# Patient Record
Sex: Female | Born: 1960
Health system: Southern US, Community
[De-identification: ages and names within clinical notes are randomized; demographics above are authoritative.]

## PROBLEM LIST (undated history)

## (undated) DIAGNOSIS — D249 Benign neoplasm of unspecified breast: Secondary | ICD-10-CM

## (undated) DIAGNOSIS — Z8669 Personal history of other diseases of the nervous system and sense organs: Secondary | ICD-10-CM

## (undated) DIAGNOSIS — M722 Plantar fascial fibromatosis: Secondary | ICD-10-CM

## (undated) DIAGNOSIS — M719 Bursopathy, unspecified: Secondary | ICD-10-CM

## (undated) DIAGNOSIS — T7840XA Allergy, unspecified, initial encounter: Secondary | ICD-10-CM

## (undated) HISTORY — DX: Plantar fascial fibromatosis: M72.2

## (undated) HISTORY — PX: BREAST BIOPSY: SHX20

## (undated) HISTORY — DX: Personal history of other diseases of the nervous system and sense organs: Z86.69

## (undated) HISTORY — DX: Allergy, unspecified, initial encounter: T78.40XA

## (undated) HISTORY — DX: Bursopathy, unspecified: M71.9

## (undated) HISTORY — DX: Benign neoplasm of unspecified breast: D24.9

---

## 2003-11-27 HISTORY — PX: ELBOW SURGERY: SHX618

## 2004-11-26 HISTORY — PX: ABDOMINAL HYSTERECTOMY: SHX81

## 2004-12-07 ENCOUNTER — Ambulatory Visit: Payer: Self-pay | Admitting: Unknown Physician Specialty

## 2005-07-12 ENCOUNTER — Ambulatory Visit: Payer: Self-pay | Admitting: Unknown Physician Specialty

## 2009-01-25 ENCOUNTER — Ambulatory Visit: Payer: Self-pay | Admitting: General Surgery

## 2009-02-14 ENCOUNTER — Ambulatory Visit: Payer: Self-pay | Admitting: General Surgery

## 2009-07-28 ENCOUNTER — Ambulatory Visit: Payer: Self-pay | Admitting: General Surgery

## 2009-08-04 ENCOUNTER — Ambulatory Visit: Payer: Self-pay | Admitting: General Surgery

## 2009-11-26 DIAGNOSIS — D249 Benign neoplasm of unspecified breast: Secondary | ICD-10-CM

## 2009-11-26 DIAGNOSIS — M719 Bursopathy, unspecified: Secondary | ICD-10-CM

## 2009-11-26 HISTORY — DX: Bursopathy, unspecified: M71.9

## 2009-11-26 HISTORY — DX: Benign neoplasm of unspecified breast: D24.9

## 2010-01-04 ENCOUNTER — Ambulatory Visit: Payer: Self-pay | Admitting: General Surgery

## 2010-08-08 ENCOUNTER — Ambulatory Visit: Payer: Self-pay | Admitting: Family Medicine

## 2011-01-25 ENCOUNTER — Ambulatory Visit: Payer: Self-pay | Admitting: General Surgery

## 2012-01-28 ENCOUNTER — Ambulatory Visit: Payer: Self-pay | Admitting: General Surgery

## 2012-03-24 ENCOUNTER — Encounter: Payer: Self-pay | Admitting: Internal Medicine

## 2012-03-24 ENCOUNTER — Ambulatory Visit (INDEPENDENT_AMBULATORY_CARE_PROVIDER_SITE_OTHER): Payer: PRIVATE HEALTH INSURANCE | Admitting: Internal Medicine

## 2012-03-24 DIAGNOSIS — M722 Plantar fascial fibromatosis: Secondary | ICD-10-CM | POA: Insufficient documentation

## 2012-03-24 DIAGNOSIS — J309 Allergic rhinitis, unspecified: Secondary | ICD-10-CM | POA: Insufficient documentation

## 2012-03-24 DIAGNOSIS — Z8669 Personal history of other diseases of the nervous system and sense organs: Secondary | ICD-10-CM | POA: Insufficient documentation

## 2012-03-24 DIAGNOSIS — Z20828 Contact with and (suspected) exposure to other viral communicable diseases: Secondary | ICD-10-CM

## 2012-03-24 DIAGNOSIS — G43909 Migraine, unspecified, not intractable, without status migrainosus: Secondary | ICD-10-CM | POA: Insufficient documentation

## 2012-03-24 DIAGNOSIS — Z13220 Encounter for screening for lipoid disorders: Secondary | ICD-10-CM

## 2012-03-24 DIAGNOSIS — R091 Pleurisy: Secondary | ICD-10-CM | POA: Insufficient documentation

## 2012-03-24 DIAGNOSIS — Z205 Contact with and (suspected) exposure to viral hepatitis: Secondary | ICD-10-CM

## 2012-03-24 MED ORDER — FLUCONAZOLE 150 MG PO TABS: 150.0000 mg | ORAL_TABLET | Freq: Every day | ORAL | Status: AC

## 2012-03-24 MED ORDER — AZITHROMYCIN 500 MG PO TABS: 500.0000 mg | ORAL_TABLET | Freq: Every day | ORAL | Status: AC

## 2012-03-24 MED ORDER — ACYCLOVIR 400 MG PO TABS: 400.0000 mg | ORAL_TABLET | Freq: Two times a day (BID) | ORAL | Status: DC

## 2012-03-24 NOTE — Assessment & Plan Note (Signed)
Managed with daily saline lavage.

## 2012-03-24 NOTE — Assessment & Plan Note (Signed)
Fasting lipids prior to next visit, her annual PE

## 2012-03-24 NOTE — Assessment & Plan Note (Signed)
Resolved with NSAIDs and MR

## 2012-03-24 NOTE — Assessment & Plan Note (Signed)
Her long time boyfriend has Hep C.  She is requesting testing.

## 2012-03-24 NOTE — Patient Instructions (Addendum)
Consider the Low Glycemic Index Diet and 6 smaller meals daily .  This boosts your metabolism and regulates your sugars:   7 AM Low carbohydrate Protein  Shakes (EAS Carb Control  Or Atkins ,  Available everywhere,   In  cases at [employer] )  2.5 carbs  (Add or substitute a toasted sandwhich thin w/ peanut butter)  10 AM: Protein bar by Atkins (snack size,  Chocolate lover's variety at  [employer])  These are very high in fiber also  Lunch: sandwich on pita bread or flatbread (Joseph's makes a pita bread and a flat bread , available at [employer] and [employer]; Toufayah makes a low carb flatbread available at [employer] and [employer]) Mission makes a low carb whole wheat tortilla available at [employer] most grocery stores   3 PM:  Mid day :  Another protein bar,  Or a  cheese stick, 1/4 cup of almonds, walnuts, pistachios, pecans, peanuts,  Macadamia nuts  6 PM  Dinner:  "mean and green:"  Meat/chicken/fish, salad, and green veggie : use ranch, vinagrette,  Blue cheese, etc  9 PM snack : Breyer's low carb fudgsicle or  ice cream bar (Carb Smart), or  Weight Watcher's ice cream bar , or another protein shake  The highlighted names are low carb high fiber bread alternatives   Return in May for fasting labs and physical

## 2012-03-24 NOTE — Progress Notes (Signed)
Patient ID: Ashley [name], female   DOB: 10/29/1961, 51 y.o.   MRN: [id number]  Patient Active Problem List  Diagnoses  . Pleurisy  . Allergy  . Plantar fasciitis of left foot  . History of migraine headaches  . Contact with and (suspected) exposure to viral hepatitis  . Screening for lipoid disorders    Subjective:  CC:   Chief Complaint  Patient presents with  . New Patient    HPI:   Ashley [name] is a 51 y.o. female who presents to establish primary care.  She is a single female., works for [employer],  And is dating an older man who has Hepatitis C with cirrhosis. No prior testing herself. Does not share razors.  Exercises infrequently due to plantar fasciitis and work schedule.  No history of exertional dyspnea or chest pain.  Occasional insomnia and migraines, has stopped using imitrex bc it caused chest and right  Scapular tightness last trial.  No prior cardiology evaluation.  Past Medical History  Diagnosis Date  . Pleurisy Jan 2013    treated with lorazepam and ibuprofen  . Allergy     managed with saline lavage  . Plantar fasciitis of left foot   . History of migraine headaches     Past Surgical History  Procedure Date  . Abdominal hysterectomy     for precancerous cervix         The following portions of the patient's history were reviewed and updated as appropriate: Allergies, current medications, and problem list.    Review of Systems:   12 Pt  review of systems was negative except those addressed in the HPI,     History   Social History  . Marital Status: Divorced    Spouse Name: N/A    Number of Children: N/A  . Years of Education: N/A   Occupational History  . Not on file.   Social History Main Topics  . Smoking status: Former Smoker    Quit date: 09/24/2011  . Smokeless tobacco: Never Used  . Alcohol Use: Yes  . Drug Use: No  . Sexually Active: Not on file   Other Topics Concern  . Not on file   Social History  Narrative  . No narrative on file    Objective:  BP 124/62  Pulse 76  Temp(Src) 98 F (36.7 C) (Oral)  Resp 16  Ht 5\' 7"  (1.702 m)  Wt 161 lb 8 oz (73.256 kg)  BMI 25.29 kg/m2  SpO2 98%  General appearance: alert, cooperative and appears stated age Ears: normal TM's and external ear canals both ears Throat: lips, mucosa, and tongue normal; teeth and gums normal Neck: no adenopathy, no carotid bruit, supple, symmetrical, trachea midline and thyroid not enlarged, symmetric, no tenderness/mass/nodules Back: symmetric, no curvature. ROM normal. No CVA tenderness. Lungs: clear to auscultation bilaterally Heart: regular rate and rhythm, S1, S2 normal, no murmur, click, rub or gallop Abdomen: soft, non-tender; bowel sounds normal; no masses,  no organomegaly Pulses: 2+ and symmetric Skin: Skin color, texture, turgor normal. No rashes or lesions Lymph nodes: Cervical, supraclavicular, and axillary nodes normal.  Assessment and Plan:  Pleurisy Resolved with NSAIDs and MR  Allergy Managed with daily saline lavage.   Contact with and (suspected) exposure to viral hepatitis Her long time boyfriend has Hep C.  She is requesting testing.   Screening for lipoid disorders Fasting lipids prior to next visit, her annual PE    Updated Medication List  Outpatient Encounter Prescriptions as of 03/24/2012  Medication Sig Dispense Refill  . acyclovir (ZOVIRAX) 400 MG tablet Take 1 tablet (400 mg total) by mouth 2 (two) times daily.  60 tablet  11  . azithromycin (ZITHROMAX) 500 MG tablet Take 1 tablet (500 mg total) by mouth daily.  7 tablet  0  . fluconazole (DIFLUCAN) 150 MG tablet Take 1 tablet (150 mg total) by mouth daily.  2 tablet  0  . ibuprofen (ADVIL,MOTRIN) 800 MG tablet Take 800 mg by mouth every 8 (eight) hours as needed.      . loratadine (CLARITIN) 10 MG tablet Take 10 mg by mouth daily.      . Naproxen Sodium (ALEVE PO) Take by mouth.      . Probiotic Product (PROBIOTIC  FORMULA PO) Take by mouth.      . DISCONTD: acyclovir (ZOVIRAX) 400 MG tablet Take 400 mg by mouth 2 (two) times daily.         Orders Placed This Encounter  Procedures  . HM MAMMOGRAPHY  . Hepatitis C RNA quantitative  . Hepatitis C Antibody  . Lipid panel  . COMPLETE METABOLIC PANEL WITH GFR  . TSH    No Follow-up on file.

## 2012-05-15 ENCOUNTER — Other Ambulatory Visit (INDEPENDENT_AMBULATORY_CARE_PROVIDER_SITE_OTHER): Payer: PRIVATE HEALTH INSURANCE | Admitting: *Deleted

## 2012-05-15 DIAGNOSIS — Z13220 Encounter for screening for lipoid disorders: Secondary | ICD-10-CM

## 2012-05-15 DIAGNOSIS — Z20828 Contact with and (suspected) exposure to other viral communicable diseases: Secondary | ICD-10-CM

## 2012-05-15 DIAGNOSIS — Z205 Contact with and (suspected) exposure to viral hepatitis: Secondary | ICD-10-CM

## 2012-05-15 LAB — LIPID PANEL
HDL: 53.7 mg/dL (ref >39.00)
Triglycerides: 111 mg/dL (ref 0.0–149.0)
VLDL: 22.2 mg/dL (ref 0.0–40.0)

## 2012-05-15 LAB — LDL CHOLESTEROL, DIRECT: Direct LDL: 165.7 mg/dL

## 2012-05-15 LAB — TSH: TSH: 1.33 u[IU]/mL (ref 0.35–5.50)

## 2012-05-16 LAB — COMPLETE METABOLIC PANEL WITH GFR
AST: 23 U/L (ref 0–37)
Albumin: 4.1 g/dL (ref 3.5–5.2)
Alkaline Phosphatase: 72 U/L (ref 39–117)
BUN: 13 mg/dL (ref 6–23)
Creat: 1.02 mg/dL (ref 0.50–1.10)
GFR, Est Non African American: 64 mL/min
Glucose, Bld: 92 mg/dL (ref 70–99)
Potassium: 5 mEq/L (ref 3.5–5.3)
Total Bilirubin: 0.6 mg/dL (ref 0.3–1.2)

## 2012-05-16 LAB — HEPATITIS C RNA QUANTITATIVE

## 2012-05-16 LAB — HEPATITIS C ANTIBODY: HCV Ab: NEGATIVE

## 2012-05-21 ENCOUNTER — Encounter: Payer: Self-pay | Admitting: Internal Medicine

## 2012-05-21 ENCOUNTER — Ambulatory Visit (INDEPENDENT_AMBULATORY_CARE_PROVIDER_SITE_OTHER): Payer: PRIVATE HEALTH INSURANCE | Admitting: Internal Medicine

## 2012-05-21 ENCOUNTER — Other Ambulatory Visit: Payer: Self-pay | Admitting: Internal Medicine

## 2012-05-21 VITALS — BP 114/68 | HR 76 | Temp 98.1°F | Resp 16 | Ht 67.0 in | Wt 167.5 lb

## 2012-05-21 DIAGNOSIS — B373 Candidiasis of vulva and vagina: Secondary | ICD-10-CM

## 2012-05-21 DIAGNOSIS — B3731 Acute candidiasis of vulva and vagina: Secondary | ICD-10-CM

## 2012-05-21 NOTE — Patient Instructions (Addendum)
I will prescribe a steroid for yur itching once I have ruled out vaginal infection.  If the steroid dose not resolve the itching, we can try hormonal creams.  I recommend you see your chiropractor about your back and hip pain .  We may need to get an MRI of your lumbar spine.  We may get a second opinion on your plantar fasciitis from a another podiatist at an academic center.

## 2012-05-22 LAB — FECAL OCCULT BLOOD, GUAIAC: Fecal Occult Blood: NEGATIVE

## 2012-05-24 LAB — CULTURE, ROUTINE-GENITAL: Organism ID, Bacteria: NORMAL

## 2012-05-25 ENCOUNTER — Encounter: Payer: Self-pay | Admitting: Internal Medicine

## 2012-05-25 NOTE — Progress Notes (Addendum)
  Subjective:     Ashley [name] is a 51 y.o. female and is here for a comprehensive physical exam. The patient reports problems - several, all of which are chronic.  History   Social History  . Marital Status: Divorced    Spouse Name: N/A    Number of Children: N/A  . Years of Education: N/A   Occupational History  . Not on file.   Social History Main Topics  . Smoking status: Former Smoker    Quit date: 09/24/2011  . Smokeless tobacco: Never Used  . Alcohol Use: Yes  . Drug Use: No  . Sexually Active: Not on file   Other Topics Concern  . Not on file   Social History Narrative  . No narrative on file   Health Maintenance  Topic Date Due  . Pap Smear  02/28/1980  . Tetanus/tdap  02/27/1981  . Mammogram  02/28/2012  . Colonoscopy  02/28/2012  . Influenza Vaccine  08/26/2012    The following portions of the patient's history were reviewed and updated as appropriate: allergies, current medications, past family history, past medical history, past social history, past surgical history and problem list.  Review of Systems Pertinent items are noted in HPI.   Objective:   BP 114/68  Pulse 76  Temp 98.1 F (36.7 C) (Oral)  Resp 16  Ht 5\' 7"  (1.702 m)  Wt 167 lb 8 oz (75.978 kg)  BMI 26.23 kg/m2  SpO2 96%  General Appearance:    Alert, cooperative, no distress, appears stated age  Head:    Normocephalic, without obvious abnormality, atraumatic  Eyes:    PERRL, conjunctiva/corneas clear, EOM's intact, fundi    benign, both eyes  Ears:    Normal TM's and external ear canals, both ears  Nose:   Nares normal, septum midline, mucosa normal, no drainage    or sinus tenderness  Throat:   Lips, mucosa, and tongue normal; teeth and gums normal  Neck:   Supple, symmetrical, trachea midline, no adenopathy;    thyroid:  no enlargement/tenderness/nodules; no carotid   bruit or JVD  Back:     Symmetric, no curvature, ROM normal, no CVA tenderness  Lungs:     Clear to auscultation  bilaterally, respirations unlabored  Chest Wall:    No tenderness or deformity   Heart:    Regular rate and rhythm, S1 and S2 normal, no murmur, rub   or gallop  Breast Exam:    No tenderness, masses, or nipple abnormality  Abdomen:     Soft, non-tender, bowel sounds active all four quadrants,    no masses, no organomegaly  Genitalia:    Normal female without lesion, discharge or tenderness  Rectal:    Normal tone, normal prostate, no masses or tenderness;   guaiac negative stool  Extremities:   Extremities normal, atraumatic, no cyanosis or edema  Pulses:   2+ and symmetric all extremities  Skin:   Skin color, texture, turgor normal, no rashes or lesions  Lymph nodes:   Cervical, supraclavicular, and axillary nodes normal  Neurologic:   CNII-XII intact, normal strength, sensation and reflexes    throughout    Assessment:    Healthy female exam.  Plan:     See After Visit Summary for Counseling Recommendations

## 2012-05-30 ENCOUNTER — Encounter: Payer: Self-pay | Admitting: Internal Medicine

## 2012-05-30 ENCOUNTER — Ambulatory Visit (INDEPENDENT_AMBULATORY_CARE_PROVIDER_SITE_OTHER): Payer: PRIVATE HEALTH INSURANCE | Admitting: Internal Medicine

## 2012-05-30 VITALS — BP 100/58 | HR 83 | Temp 98.4°F | Resp 16 | Wt 169.0 lb

## 2012-05-30 DIAGNOSIS — H6980 Other specified disorders of Eustachian tube, unspecified ear: Secondary | ICD-10-CM

## 2012-05-30 MED ORDER — PREDNISONE (PAK) 10 MG PO TABS: ORAL_TABLET | ORAL | Status: AC

## 2012-05-30 MED ORDER — MOMETASONE FUROATE 50 MCG/ACT NA SUSP: NASAL | Status: DC

## 2012-05-30 NOTE — Progress Notes (Signed)
Patient ID: Ashley [name], female   DOB: November 03, 1961, 51 y.o.   MRN: [id number] Patient Active Problem List  Diagnosis  . Pleurisy  . Allergy  . Plantar fasciitis of left foot  . History of migraine headaches  . Contact with and (suspected) exposure to viral hepatitis  . Screening for lipoid disorders    Subjective:  CC:   Chief Complaint  Patient presents with  . Otalgia    HPI:   Ashley [name] a 51 y.o. female who presents with recurrent right ear pain .  She was treated in April with azithromycin, and decongestants but the feeling in right ear never completely normalized.  She denies headaches, vertigo and ear drainage..   Ear does not pop of autoinflation.  She has had a prior ENT eval by [facility] Dr. Candida Peeling who rxd allegra d. Which  She stopped taking when it became OTC medication . No recent air flights, swimming or sick contacts.   Past Medical History  Diagnosis Date  . Pleurisy Jan 2013    treated with lorazepam and ibuprofen  . Allergy     managed with saline lavage  . Plantar fasciitis of left foot   . History of migraine headaches     Past Surgical History  Procedure Date  . Abdominal hysterectomy     for precancerous cervix     The following portions of the patient's history were reviewed and updated as appropriate: Allergies, current medications, and problem list.    Review of Systems:  The remainder of a comprejensive  review of systems was negative except those addressed in the HPI,   History   Social History  . Marital Status: Divorced    Spouse Name: N/A    Number of Children: N/A  . Years of Education: N/A   Occupational History  . Not on file.   Social History Main Topics  . Smoking status: Former Smoker    Quit date: 09/24/2011  . Smokeless tobacco: Never Used  . Alcohol Use: Yes  . Drug Use: No  . Sexually Active: Not on file   Other Topics Concern  . Not on file   Social History Narrative  . No narrative on file     Objective:  BP 100/58  Pulse 83  Temp 98.4 F (36.9 C) (Oral)  Resp 16  Wt 169 lb (76.658 kg)  SpO2 98%  General appearance: alert, cooperative and appears stated age Ears: both  TM's are opaque with serous effusions,  No erythema or bulging noted Throat: lips, mucosa, and tongue normal; teeth and gums normal Neck: no adenopathy, no carotid bruit, supple, symmetrical, trachea midline and thyroid not enlarged, symmetric, no tenderness/mass/nodules Lungs: clear to auscultation bilaterally Heart: regular rate and rhythm, S1, S2 normal, no murmur, click, rub or gallop Lymph nodes: Cervical, supraclavicular, and axillary nodes normal.  Assessment and Plan:  Eustachian tube dysfunction Secondary to uncontrolled congestion of presumed reversible/allergic etiology.  Prednisone, nasonex and sudafed PE short term.  Resume daily allegra.    Updated Medication List Outpatient Encounter Prescriptions as of 05/30/2012  Medication Sig Dispense Refill  . acyclovir (ZOVIRAX) 400 MG tablet Take 1 tablet (400 mg total) by mouth 2 (two) times daily.  60 tablet  11  . ibuprofen (ADVIL,MOTRIN) 800 MG tablet Take 800 mg by mouth every 8 (eight) hours as needed.      . loratadine (CLARITIN) 10 MG tablet Take 10 mg by mouth daily.      . Naproxen  Sodium (ALEVE PO) Take by mouth.      . Probiotic Product (PROBIOTIC FORMULA PO) Take by mouth.      . mometasone (NASONEX) 50 MCG/ACT nasal spray 2 sprays in each nostril daily  17 g  12  . predniSONE (STERAPRED UNI-PAK) 10 MG tablet 6 tablets on Day 1 , then reduce by 1 tablet daily until gone  21 tablet  0

## 2012-05-30 NOTE — Patient Instructions (Addendum)
I am going to treat you with a prednisone  Taper for 6 days,  While we can get nasonex on board.  Use sudafed PE 10 to 30 mg every 6 hours for decongestant,  And benadryl (diphenhydramine) 25 mg at bedtime  Generic allegra is fexofenadine 180 mg daily    Continue once or twice daily saline flushes (before your nasonex)

## 2012-06-01 DIAGNOSIS — H6990 Unspecified Eustachian tube disorder, unspecified ear: Secondary | ICD-10-CM | POA: Insufficient documentation

## 2012-06-01 DIAGNOSIS — H6980 Other specified disorders of Eustachian tube, unspecified ear: Secondary | ICD-10-CM | POA: Insufficient documentation

## 2012-06-01 NOTE — Assessment & Plan Note (Signed)
Secondary to uncontrolled congestion of presumed reversible/allergic etiology.  Prednisone, nasonex and sudafed PE short term.  Resume daily allegra.

## 2012-06-12 ENCOUNTER — Encounter: Payer: Self-pay | Admitting: Internal Medicine

## 2012-06-12 ENCOUNTER — Ambulatory Visit (INDEPENDENT_AMBULATORY_CARE_PROVIDER_SITE_OTHER): Payer: PRIVATE HEALTH INSURANCE | Admitting: Internal Medicine

## 2012-06-12 VITALS — BP 120/80 | HR 68 | Temp 98.7°F | Ht 67.0 in | Wt 168.0 lb

## 2012-06-12 DIAGNOSIS — N952 Postmenopausal atrophic vaginitis: Secondary | ICD-10-CM

## 2012-06-12 DIAGNOSIS — J309 Allergic rhinitis, unspecified: Secondary | ICD-10-CM

## 2012-06-12 DIAGNOSIS — H6980 Other specified disorders of Eustachian tube, unspecified ear: Secondary | ICD-10-CM

## 2012-06-12 MED ORDER — ESTRADIOL 0.1 MG/GM VA CREA: TOPICAL_CREAM | VAGINAL | Status: DC

## 2012-06-12 NOTE — Progress Notes (Signed)
Patient ID: Ashley [name], female   DOB: 10-10-1961, 51 y.o.   MRN: [id number]  Patient Active Problem List  Diagnosis  . Pleurisy  . Allergic rhinitis  . Plantar fasciitis of left foot  . History of migraine headaches  . Contact with and (suspected) exposure to viral hepatitis  . Screening for lipoid disorders  . Eustachian tube dysfunction  . Perimenopausal atrophic vaginitis    Subjective:  CC:   Chief Complaint  Patient presents with  . Vaginitis    ?possible yeast infection  . Otalgia    Right ear    HPI:   Ashley [name] a 51 y.o. female who presents Persistent vaginal burning despite recent normal pelvic exam. She denies discharge but is frustrated because the symptoms are preventing her from having intimacy.  We discussed s estrogen vaginal cream at last visit she is wanting to discuss that today.   2) Her ear feels better after resuming Allegra and completing a prednisone taper. However she feels worn out and has not been sleeping well due to to the side effects of the prednisone.   Past Medical History  Diagnosis Date  . Pleurisy Jan 2013    treated with lorazepam and ibuprofen  . Allergy     managed with saline lavage  . Plantar fasciitis of left foot   . History of migraine headaches     Past Surgical History  Procedure Date  . Abdominal hysterectomy     for precancerous cervix         The following portions of the patient's history were reviewed and updated as appropriate: Allergies, current medications, and problem list.    Review of Systems:   12 Pt  review of systems was negative except those addressed in the HPI,     History   Social History  . Marital Status: Divorced    Spouse Name: N/A    Number of Children: N/A  . Years of Education: N/A   Occupational History  . Not on file.   Social History Main Topics  . Smoking status: Former Smoker    Quit date: 09/24/2011  . Smokeless tobacco: Never Used  . Alcohol Use: Yes  . Drug  Use: No  . Sexually Active: Not on file   Other Topics Concern  . Not on file   Social History Narrative  . No narrative on file    Objective:  BP 120/80  Pulse 68  Temp 98.7 F (37.1 C) (Oral)  Ht 5\' 7"  (1.702 m)  Wt 168 lb (76.204 kg)  BMI 26.31 kg/m2  SpO2 99%  General appearance: alert, cooperative and appears stated age Ears: normal TM's and external ear canals both ears Neck: no adenopathy, no carotid bruit, supple, symmetrical, trachea midline and thyroid not enlarged, symmetric, no tenderness/mass/nodules Back: symmetric, no curvature. ROM normal. No CVA tenderness. Lungs: clear to auscultation bilaterally Heart: regular rate and rhythm, S1, S2 normal, no murmur, click, rub or gallop Abdomen: soft, non-tender; bowel sounds normal; no masses,  no organomegaly Pulses: 2+ and symmetric Skin: Skin color, texture, turgor normal. No rashes or lesions Lymph nodes: Cervical, supraclavicular, and axillary nodes normal.  Assessment and Plan:  Perimenopausal atrophic vaginitis Trial of estrogen cream 1-2 g nightly for 2 weeks followed by decreased use to twice weekly.  Allergic rhinitis Improved with resuming Allegra and Nasonex.. No changes today  Eustachian tube dysfunction Secondary to prolonged inflammation and congestion. Symptoms improved post prednisone use and initiation of Allegra and  Nasonex. She'll continue saline lavages Allegra and Nasonex on a daily basis.   Updated Medication List Outpatient Encounter Prescriptions as of 06/12/2012  Medication Sig Dispense Refill  . acyclovir (ZOVIRAX) 400 MG tablet Take 1 tablet (400 mg total) by mouth 2 (two) times daily.  60 tablet  11  . fexofenadine (ALLEGRA) 180 MG tablet Take 180 mg by mouth daily.      Marland Kitchen ibuprofen (ADVIL,MOTRIN) 800 MG tablet Take 800 mg by mouth every 8 (eight) hours as needed.      . mometasone (NASONEX) 50 MCG/ACT nasal spray 2 sprays in each nostril daily  17 g  12  . Naproxen Sodium (ALEVE  PO) Take by mouth.      . Phenylephrine HCl (NASAL DECONGESTANT PE PO) Take 1 tablet by mouth daily.      . Probiotic Product (PROBIOTIC FORMULA PO) Take by mouth.      . estradiol (ESTRACE) 0.1 MG/GM vaginal cream 1 to 2 grams per vaginal every night for 2 weeks , then twice weekly  42.5 g  12  . DISCONTD: loratadine (CLARITIN) 10 MG tablet Take 10 mg by mouth daily.         No orders of the defined types were placed in this encounter.    No Follow-up on file.

## 2012-06-12 NOTE — Patient Instructions (Addendum)
Start the vaginal cream  1 to 2 grams per night for 2 weeks,  (after sex)  , then reduce to twice weekly

## 2012-06-14 ENCOUNTER — Encounter: Payer: Self-pay | Admitting: Internal Medicine

## 2012-06-14 DIAGNOSIS — N952 Postmenopausal atrophic vaginitis: Secondary | ICD-10-CM | POA: Insufficient documentation

## 2012-06-14 NOTE — Assessment & Plan Note (Addendum)
Improved with resuming Allegra and Nasonex.. No changes today

## 2012-06-14 NOTE — Assessment & Plan Note (Signed)
Secondary to prolonged inflammation and congestion. Symptoms improved post prednisone use and initiation of Allegra and Nasonex. She'll continue saline lavages Allegra and Nasonex on a daily basis.

## 2012-06-14 NOTE — Assessment & Plan Note (Signed)
Trial of estrogen cream 1-2 g nightly for 2 weeks followed by decreased use to twice weekly.

## 2012-06-17 ENCOUNTER — Telehealth: Payer: Self-pay | Admitting: Internal Medicine

## 2012-06-17 NOTE — Telephone Encounter (Signed)
Patient called and wanted to know if the estrogen cream could be causing migraines.  She stated she has had a migraine everyday since starting on the cream.  Please advise.

## 2012-06-17 NOTE — Telephone Encounter (Signed)
It has not been reported, as far as I know. It may be a coincidence  Have her stop it and restart it in a week

## 2012-06-17 NOTE — Telephone Encounter (Signed)
Left detailed message notifying patient.

## 2012-06-24 ENCOUNTER — Other Ambulatory Visit: Payer: Self-pay | Admitting: Internal Medicine

## 2012-06-24 NOTE — Telephone Encounter (Signed)
Patient stated you gave her samples of Lunesta 3 mg tablets and they are working well for her.  She requested an Rx.  Please advise.

## 2012-06-25 MED ORDER — ESZOPICLONE 3 MG PO TABS: 3.0000 mg | ORAL_TABLET | Freq: Every day | ORAL | Status: DC

## 2012-06-27 ENCOUNTER — Telehealth: Payer: Self-pay | Admitting: Internal Medicine

## 2012-06-27 NOTE — Telephone Encounter (Signed)
Error

## 2012-07-02 ENCOUNTER — Telehealth: Payer: Self-pay | Admitting: Internal Medicine

## 2012-07-02 NOTE — Telephone Encounter (Signed)
Please call patient and ask her if she has tried either [place] or [id number]. If not we will try generic ambien 10 mg one tablet at bedtiem prn insomnia.  #30 2 refills

## 2012-07-02 NOTE — Telephone Encounter (Signed)
I had to do a prior authorization for patients Lunesta, the insurance company stated she will have to try and fail Ambien or [place] before getting the [place] approved.  Please advise.

## 2012-07-04 MED ORDER — ZOLPIDEM TARTRATE 10 MG PO TABS: 10.0000 mg | ORAL_TABLET | Freq: Every evening | ORAL | Status: DC | PRN

## 2012-07-04 NOTE — Telephone Encounter (Signed)
Patient stated she has not tried [place] or [id number].  Rx for Remus Loffler has been called in.

## 2013-01-03 ENCOUNTER — Encounter: Payer: Self-pay | Admitting: *Deleted

## 2013-01-03 DIAGNOSIS — D249 Benign neoplasm of unspecified breast: Secondary | ICD-10-CM | POA: Insufficient documentation

## 2013-01-03 DIAGNOSIS — M719 Bursopathy, unspecified: Secondary | ICD-10-CM | POA: Insufficient documentation

## 2013-01-10 ENCOUNTER — Other Ambulatory Visit: Payer: Self-pay

## 2013-01-15 ENCOUNTER — Ambulatory Visit (INDEPENDENT_AMBULATORY_CARE_PROVIDER_SITE_OTHER): Payer: PRIVATE HEALTH INSURANCE | Admitting: Adult Health

## 2013-01-15 ENCOUNTER — Encounter: Payer: Self-pay | Admitting: Adult Health

## 2013-01-15 VITALS — BP 124/79 | HR 74 | Temp 98.7°F | Resp 14 | Ht 66.0 in | Wt 171.0 lb

## 2013-01-15 DIAGNOSIS — N898 Other specified noninflammatory disorders of vagina: Secondary | ICD-10-CM

## 2013-01-15 DIAGNOSIS — N952 Postmenopausal atrophic vaginitis: Secondary | ICD-10-CM | POA: Insufficient documentation

## 2013-01-15 DIAGNOSIS — L293 Anogenital pruritus, unspecified: Secondary | ICD-10-CM

## 2013-01-15 MED ORDER — FLUCONAZOLE 150 MG PO TABS: 150.0000 mg | ORAL_TABLET | Freq: Once | ORAL | Status: DC

## 2013-01-15 MED ORDER — NYSTATIN 100000 UNIT/GM EX CREA: TOPICAL_CREAM | Freq: Two times a day (BID) | CUTANEOUS | Status: DC

## 2013-01-15 NOTE — Patient Instructions (Addendum)
  I have ordered nystatin cream for you to apply externally twice a day. This will help with itching and symptoms you have been experiencing.  I have also order 1 tablet of Diflucan. This will take care of the yeast.  I will notify you of the culture results once they are available.  Please call the office if you are not any better within 1 week.

## 2013-01-15 NOTE — Progress Notes (Signed)
  Subjective:    Patient ID: Ashley [name], female    DOB: 03/05/1961, 52 y.o.   MRN: [id number]  HPI  Patient is a 52 y/o female who presents to clinic with reports of vaginal discomfort, raw, itching in and around vagina/vulva. Symptoms began on Friday. She used a generic monistat cream thinking this might be yeast. She is on estrace cream twice a week. Patient is s/p hysterectomy. She denies vaginal bleeding, drainage, ulcers. She is sexually active but has not had intercourse in ~ 1 week.   Current Outpatient Prescriptions on File Prior to Visit  Medication Sig Dispense Refill  . estradiol (ESTRACE) 0.1 MG/GM vaginal cream 1 to 2 grams per vaginal every night for 2 weeks , then twice weekly  42.5 g  12  . fexofenadine (ALLEGRA) 180 MG tablet Take 180 mg by mouth daily.      Marland Kitchen ibuprofen (ADVIL,MOTRIN) 800 MG tablet Take 800 mg by mouth every 8 (eight) hours as needed.      . mometasone (NASONEX) 50 MCG/ACT nasal spray 2 sprays in each nostril daily  17 g  12  . Naproxen Sodium (ALEVE PO) Take by mouth.      . Probiotic Product (PROBIOTIC FORMULA PO) Take by mouth.      Marland Kitchen acyclovir (ZOVIRAX) 400 MG tablet Take 1 tablet (400 mg total) by mouth 2 (two) times daily.  60 tablet  11  . Eszopiclone (ESZOPICLONE) 3 MG TABS Take 1 tablet (3 mg total) by mouth at bedtime. Take immediately before bedtime  30 tablet  2  . Phenylephrine HCl (NASAL DECONGESTANT PE PO) Take 1 tablet by mouth daily.      Marland Kitchen zolpidem (AMBIEN) 10 MG tablet Take 1 tablet (10 mg total) by mouth at bedtime as needed for sleep.  30 tablet  2   No current facility-administered medications on file prior to visit.     Review of Systems  Constitutional: Negative for fever and chills.  Gastrointestinal: Negative for nausea, vomiting, abdominal pain and blood in stool.  Genitourinary: Negative for dysuria, hematuria, flank pain, vaginal bleeding, vaginal discharge and genital sores.       Positive for vaginal itch, irritation   Skin:       No lesions reported. She has not changed soaps or detergents.  Psychiatric/Behavioral: Negative.         Objective:   Physical Exam  Constitutional: She is oriented to person, place, and time. She appears well-developed and well-nourished. No distress.  Abdominal: Soft. Bowel sounds are normal.  Genitourinary: No labial fusion. There is no rash, lesion or injury on the right labia. There is no rash, lesion or injury on the left labia. No erythema or bleeding around the vagina. No foreign body around the vagina. No signs of injury around the vagina. Vaginal discharge found.  White substance noted in vagina. Patient uses estrace vaginal cream.  Neurological: She is alert and oriented to person, place, and time.  Skin: Skin is warm and dry. No rash noted. No erythema.  Psychiatric: She has a normal mood and affect. Her behavior is normal. Judgment and thought content normal.           Assessment & Plan:

## 2013-01-15 NOTE — Assessment & Plan Note (Signed)
No irritation noted within vagina or externally. Ordered Diflucan tablet and nystatin cream for external application. RTC if no improvement within 1 week.

## 2013-01-18 LAB — CULTURE, ROUTINE-GENITAL: Organism ID, Bacteria: NORMAL

## 2013-02-22 ENCOUNTER — Other Ambulatory Visit: Payer: Self-pay | Admitting: Adult Health

## 2013-02-24 NOTE — Telephone Encounter (Signed)
Ashley [name],  Patient wanting a refill;Please advise.  [name]

## 2013-03-02 ENCOUNTER — Telehealth: Payer: Self-pay | Admitting: *Deleted

## 2013-03-02 MED ORDER — ESTRADIOL 0.1 MG/GM VA CREA: TOPICAL_CREAM | VAGINAL | Status: DC

## 2013-03-02 NOTE — Telephone Encounter (Signed)
Patient called voice mail wanting refill on her estrace. Patient stated that she bis completely out. [facility].

## 2013-03-02 NOTE — Telephone Encounter (Signed)
Med filled.  

## 2013-03-09 ENCOUNTER — Telehealth: Payer: Self-pay | Admitting: Internal Medicine

## 2013-03-09 DIAGNOSIS — E785 Hyperlipidemia, unspecified: Secondary | ICD-10-CM

## 2013-03-09 DIAGNOSIS — R5381 Other malaise: Secondary | ICD-10-CM

## 2013-03-09 DIAGNOSIS — Z1239 Encounter for other screening for malignant neoplasm of breast: Secondary | ICD-10-CM

## 2013-03-09 DIAGNOSIS — R5383 Other fatigue: Secondary | ICD-10-CM

## 2013-03-09 NOTE — Telephone Encounter (Signed)
Scheduled

## 2013-03-09 NOTE — Telephone Encounter (Signed)
Pt is having CPE in June and wanted to know if we could put an order in for her mammo ??

## 2013-03-09 NOTE — Telephone Encounter (Signed)
Labs ordered.  Make appt for fasting labs.

## 2013-03-09 NOTE — Telephone Encounter (Signed)
Patient wanting blood work done prior to her appointment on 6.27.14. She has [employer].

## 2013-03-09 NOTE — Telephone Encounter (Signed)
Can you schedule for fasting labs?

## 2013-03-09 NOTE — Telephone Encounter (Signed)
Please advise see patient note.

## 2013-03-10 NOTE — Telephone Encounter (Signed)
Referral is in process as requested 

## 2013-03-12 ENCOUNTER — Telehealth: Payer: Self-pay

## 2013-03-12 NOTE — Telephone Encounter (Signed)
Returned pt call. Pt was wanted to know the about her mammogram. I told pt from the notes that i saw that the referral process as been started about her mammagram.

## 2013-03-16 ENCOUNTER — Encounter: Payer: Self-pay | Admitting: Emergency Medicine

## 2013-04-13 ENCOUNTER — Ambulatory Visit: Payer: Self-pay | Admitting: Internal Medicine

## 2013-04-21 LAB — HM MAMMOGRAPHY: HM Mammogram: NORMAL

## 2013-04-28 ENCOUNTER — Other Ambulatory Visit: Payer: Self-pay | Admitting: Internal Medicine

## 2013-04-28 NOTE — Telephone Encounter (Signed)
Please advise 

## 2013-05-15 ENCOUNTER — Other Ambulatory Visit (INDEPENDENT_AMBULATORY_CARE_PROVIDER_SITE_OTHER): Payer: PRIVATE HEALTH INSURANCE

## 2013-05-15 DIAGNOSIS — R5381 Other malaise: Secondary | ICD-10-CM

## 2013-05-15 DIAGNOSIS — E785 Hyperlipidemia, unspecified: Secondary | ICD-10-CM

## 2013-05-15 LAB — COMPREHENSIVE METABOLIC PANEL
ALT: 11 U/L (ref 0–35)
AST: 18 U/L (ref 0–37)
Albumin: 3.8 g/dL (ref 3.5–5.2)
CO2: 27 mEq/L (ref 19–32)
Calcium: 9.5 mg/dL (ref 8.4–10.5)
Chloride: 108 mEq/L (ref 96–112)
GFR: 68.34 mL/min (ref >60.00)
Potassium: 4.5 mEq/L (ref 3.5–5.1)

## 2013-05-15 LAB — CBC WITH DIFFERENTIAL/PLATELET
Basophils Absolute: 0.1 10*3/uL (ref 0.0–0.1)
Basophils Relative: 0.8 % (ref 0.0–3.0)
Eosinophils Absolute: 0.2 10*3/uL (ref 0.0–0.7)
Hemoglobin: 13.4 g/dL (ref 12.0–15.0)
Lymphocytes Relative: 30.9 % (ref 12.0–46.0)
MCHC: 33.1 g/dL (ref 30.0–36.0)
Monocytes Relative: 7.1 % (ref 3.0–12.0)
Neutrophils Relative %: 57.5 % (ref 43.0–77.0)
RBC: 4.23 Mil/uL (ref 3.87–5.11)
RDW: 14.1 % (ref 11.5–14.6)

## 2013-05-15 LAB — LIPID PANEL: Total CHOL/HDL Ratio: 6

## 2013-05-15 LAB — LDL CHOLESTEROL, DIRECT: Direct LDL: 138.2 mg/dL

## 2013-05-18 ENCOUNTER — Encounter: Payer: Self-pay | Admitting: Internal Medicine

## 2013-05-22 ENCOUNTER — Encounter: Payer: Self-pay | Admitting: Internal Medicine

## 2013-05-22 ENCOUNTER — Ambulatory Visit (INDEPENDENT_AMBULATORY_CARE_PROVIDER_SITE_OTHER): Payer: PRIVATE HEALTH INSURANCE | Admitting: Internal Medicine

## 2013-05-22 VITALS — BP 108/68 | HR 69 | Temp 98.1°F | Resp 14 | Ht 67.0 in | Wt 161.8 lb

## 2013-05-22 DIAGNOSIS — Z1211 Encounter for screening for malignant neoplasm of colon: Secondary | ICD-10-CM

## 2013-05-22 DIAGNOSIS — Z716 Tobacco abuse counseling: Secondary | ICD-10-CM

## 2013-05-22 DIAGNOSIS — Z20828 Contact with and (suspected) exposure to other viral communicable diseases: Secondary | ICD-10-CM

## 2013-05-22 DIAGNOSIS — L293 Anogenital pruritus, unspecified: Secondary | ICD-10-CM

## 2013-05-22 DIAGNOSIS — Z205 Contact with and (suspected) exposure to viral hepatitis: Secondary | ICD-10-CM

## 2013-05-22 DIAGNOSIS — Z7189 Other specified counseling: Secondary | ICD-10-CM

## 2013-05-22 DIAGNOSIS — Z0000 Encounter for general adult medical examination without abnormal findings: Secondary | ICD-10-CM

## 2013-05-22 DIAGNOSIS — M722 Plantar fascial fibromatosis: Secondary | ICD-10-CM

## 2013-05-22 DIAGNOSIS — Z720 Tobacco use: Secondary | ICD-10-CM

## 2013-05-22 DIAGNOSIS — F17200 Nicotine dependence, unspecified, uncomplicated: Secondary | ICD-10-CM

## 2013-05-22 DIAGNOSIS — N898 Other specified noninflammatory disorders of vagina: Secondary | ICD-10-CM

## 2013-05-22 LAB — FECAL OCCULT BLOOD, GUAIAC: Fecal Occult Blood: NEGATIVE

## 2013-05-22 MED ORDER — FLUCONAZOLE 150 MG PO TABS: 150.0000 mg | ORAL_TABLET | Freq: Once | ORAL | Status: DC

## 2013-05-22 MED ORDER — ESZOPICLONE 3 MG PO TABS: 3.0000 mg | ORAL_TABLET | Freq: Every day | ORAL | Status: DC

## 2013-05-22 MED ORDER — NYSTATIN 100000 UNIT/GM EX CREA: TOPICAL_CREAM | Freq: Two times a day (BID) | CUTANEOUS | Status: DC

## 2013-05-22 MED ORDER — FEXOFENADINE HCL 180 MG PO TABS: 180.0000 mg | ORAL_TABLET | Freq: Every day | ORAL | Status: AC

## 2013-05-22 MED ORDER — FLUTICASONE PROPIONATE 50 MCG/ACT NA SUSP: 2.0000 | Freq: Every day | NASAL | Status: DC

## 2013-05-22 MED ORDER — ALPRAZOLAM 0.25 MG PO TABS: 0.2500 mg | ORAL_TABLET | Freq: Two times a day (BID) | ORAL | Status: DC | PRN

## 2013-05-22 NOTE — Patient Instructions (Addendum)
Your exam was normal today.    Your fecal occult blood test was negative (normal).   I am prescribing alprazolam , a mild anxiolytic (sedative) to take as needed (not more than once daily) for stress that makes you want to smoke

## 2013-05-22 NOTE — Progress Notes (Signed)
Patient ID: Ashley [name], female   DOB: March 15, 1961, 52 y.o.   MRN: [id number]   Subjective:     Ashley [name] is a 52 y.o. female and is here for a comprehensive physical exam. The patient reports that she had quit smoking but has slipped a llittle bc of stress. . More active has lost weight since her foot pain has improved. 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 She has had her mammogram and is s/p hysterectomy.  Husband has hepatitis C and has recently started the new interferon treatment and she has multiple questions about his treatment, her risks or transmitting infection to him and her risk of becoming infected.  She was screened last year for Hep C and was negative.  They are not using barrier method protection with intercourse.    Marland Kitchen  History   Social History  . Marital Status: Divorced    Spouse Name: N/A    Number of Children: N/A  . Years of Education: N/A   Occupational History  . Not on file.   Social History Main Topics  . Smoking status: Former Smoker    Quit date: 09/24/2011  . Smokeless tobacco: Never Used  . Alcohol Use: Yes  . Drug Use: No  . Sexually Active: Not on file   Other Topics Concern  . Not on file   Social History Narrative  . No narrative on file   Health Maintenance  Topic Date Due  . Pap Smear  02/28/1980  . Tetanus/tdap  02/27/1981  . Colonoscopy  02/28/2012  . Influenza Vaccine  07/27/2013  . Mammogram  04/22/2015    The following portions of the patient's history were reviewed and updated as appropriate: allergies, current medications, past family history, past medical history, past social history, past surgical history and problem list.  Review of Systems A comprehensive review of systems was negative.   Objective:     BP 108/68  Pulse 69  Temp(Src) 98.1 F (36.7 C) (Oral)  Resp 14  Ht 5\' 7"  (1.702 m)  Wt 161 lb 12 oz (73.369 kg)  BMI 25.33 kg/m2  SpO2 98%  General Appearance:     Alert, cooperative, no distress, appears stated age  Head:    Normocephalic, without obvious abnormality, atraumatic  Eyes:    PERRL, conjunctiva/corneas clear, EOM's intact, fundi    benign, both eyes  Ears:    Normal TM's and external ear canals, both ears  Nose:   Nares normal, septum midline, mucosa normal, no drainage    or sinus tenderness  Throat:   Lips, mucosa, and tongue normal; teeth and gums normal  Neck:   Supple, symmetrical, trachea midline, no adenopathy;    thyroid:  no enlargement/tenderness/nodules; no carotid   bruit or JVD  Back:     Symmetric, no curvature, ROM normal, no CVA tenderness  Lungs:     Clear to auscultation bilaterally, respirations unlabored  Chest Wall:    No tenderness or deformity   Heart:    Regular rate and rhythm, S1 and S2 normal, no murmur, rub   or gallop  Breast Exam:    No tenderness, masses, or nipple abnormality  Abdomen:     Soft, non-tender, bowel sounds active all four quadrants,    no masses, no organomegaly  Genitalia:    Pelvic:  external genitalia normal, no adnexal masses or tenderness, ,uterus surgically absent,  rectovaginal septum normal, vagina  normal without discharge  Extremities:   Extremities normal, atraumatic, no cyanosis or edema  Pulses:   2+ and symmetric all extremities  Skin:   Skin color, texture, turgor normal, no rashes or lesions  Lymph nodes:   Cervical, supraclavicular, and axillary nodes normal  Neurologic:   CNII-XII intact, normal strength, sensation and reflexes    throughout      Assessment:   Exposure to hepatitis C counselling given,  All questions answered .  She does not want annual testing and is not interested in using condoms .  She is not sharing toothbrushes and razors.   Plantar fasciitis of left foot Improving.  She has resumed regular exercise and has lost 10 lbs since last visit   Postmenopausal atrophic vaginitis Chronic,  No signs of infection on prior exam or today .  Discussed  premarin cream but would like her to quit smoking again   Routine general medical examination at a health care facility Annual comprehensive exam was done including breast, pelvic exam. All screenings have been addressed .   Tobacco abuse counseling given,  Pharmacotherapy discussed . Alprazolam prescribed for management of episodes of anxiety that cause her to reach for cigarettes.    Updated Medication List Outpatient Encounter Prescriptions as of 05/22/2013  Medication Sig Dispense Refill  . acyclovir (ZOVIRAX) 400 MG tablet take 1 tablet by mouth twice a day  60 tablet  5  . estradiol (ESTRACE) 0.1 MG/GM vaginal cream 1 to 2 grams per vaginal every night for 2 weeks , then twice weekly  42.5 g  3  . fexofenadine (ALLEGRA) 180 MG tablet Take 1 tablet (180 mg total) by mouth daily.  90 tablet  1  . fluconazole (DIFLUCAN) 150 MG tablet Take 1 tablet (150 mg total) by mouth once.  1 tablet  0  . mometasone (NASONEX) 50 MCG/ACT nasal spray 2 sprays in each nostril daily  17 g  12  . Naproxen Sodium (ALEVE PO) Take by mouth.      . Phenylephrine HCl (NASAL DECONGESTANT PE PO) Take 1 tablet by mouth daily.      . Probiotic Product (PROBIOTIC FORMULA PO) Take by mouth.      . [DISCONTINUED] fexofenadine (ALLEGRA) 180 MG tablet Take 180 mg by mouth daily.      . [DISCONTINUED] fluconazole (DIFLUCAN) 150 MG tablet Take 1 tablet (150 mg total) by mouth once.  1 tablet  0  . ALPRAZolam (XANAX) 0.25 MG tablet Take 1 tablet (0.25 mg total) by mouth 2 (two) times daily as needed for sleep.  60 tablet  1  . Eszopiclone (ESZOPICLONE) 3 MG TABS Take 1 tablet (3 mg total) by mouth at bedtime. Take immediately before bedtime  30 tablet  2  . fluticasone (FLONASE) 50 MCG/ACT nasal spray Place 2 sprays into the nose daily.  16 g  6  . ibuprofen (ADVIL,MOTRIN) 800 MG tablet Take 800 mg by mouth every 8 (eight) hours as needed.      . nystatin cream (MYCOSTATIN) Apply topically 2 (two) times daily.  30 g  0  .  zolpidem (AMBIEN) 10 MG tablet Take 1 tablet (10 mg total) by mouth at bedtime as needed for sleep.  30 tablet  2  . [DISCONTINUED] Eszopiclone (ESZOPICLONE) 3 MG TABS Take 1 tablet (3 mg total) by mouth at bedtime. Take immediately before bedtime  30 tablet  2  . [DISCONTINUED] nystatin cream (MYCOSTATIN) Apply topically 2 (two) times daily.  30 g  0   No facility-administered encounter medications on file as of 05/22/2013.

## 2013-05-24 ENCOUNTER — Encounter: Payer: Self-pay | Admitting: Internal Medicine

## 2013-05-24 DIAGNOSIS — Z0000 Encounter for general adult medical examination without abnormal findings: Secondary | ICD-10-CM | POA: Insufficient documentation

## 2013-05-24 DIAGNOSIS — Z716 Tobacco abuse counseling: Secondary | ICD-10-CM | POA: Insufficient documentation

## 2013-05-24 DIAGNOSIS — Z87891 Personal history of nicotine dependence: Secondary | ICD-10-CM | POA: Insufficient documentation

## 2013-05-24 DIAGNOSIS — Z299 Encounter for prophylactic measures, unspecified: Secondary | ICD-10-CM | POA: Insufficient documentation

## 2013-05-24 NOTE — Assessment & Plan Note (Signed)
Annual comprehensive exam was done including breast, pelvic exam. All screenings have been addressed .  

## 2013-05-24 NOTE — Assessment & Plan Note (Addendum)
Improving.  She has resumed regular exercise and has lost 10 lbs since last visit

## 2013-05-24 NOTE — Assessment & Plan Note (Addendum)
Chronic,  No signs of infection on prior exam or today .  Discussed premarin cream but would like her to quit smoking again

## 2013-05-24 NOTE — Assessment & Plan Note (Signed)
counselling given,  All questions answered .  She does not want annual testing and is not interested in using condoms .  She is not sharing toothbrushes and razors.

## 2013-05-24 NOTE — Assessment & Plan Note (Signed)
counseling given,  Pharmacotherapy discussed . Alprazolam prescribed for management of episodes of anxiety that cause her to reach for cigarettes.

## 2013-06-08 ENCOUNTER — Encounter: Payer: Self-pay | Admitting: Internal Medicine

## 2013-10-01 ENCOUNTER — Other Ambulatory Visit: Payer: Self-pay

## 2014-09-27 ENCOUNTER — Encounter: Payer: Self-pay | Admitting: Internal Medicine

## 2016-04-25 ENCOUNTER — Encounter: Payer: Self-pay | Admitting: Internal Medicine

## 2016-04-25 ENCOUNTER — Ambulatory Visit (INDEPENDENT_AMBULATORY_CARE_PROVIDER_SITE_OTHER): Payer: 59 | Admitting: Internal Medicine

## 2016-04-25 VITALS — BP 102/68 | HR 96 | Temp 98.6°F | Resp 12 | Ht 67.0 in | Wt 146.5 lb

## 2016-04-25 DIAGNOSIS — Z1239 Encounter for other screening for malignant neoplasm of breast: Secondary | ICD-10-CM | POA: Diagnosis not present

## 2016-04-25 DIAGNOSIS — Z299 Encounter for prophylactic measures, unspecified: Secondary | ICD-10-CM

## 2016-04-25 DIAGNOSIS — E559 Vitamin D deficiency, unspecified: Secondary | ICD-10-CM

## 2016-04-25 DIAGNOSIS — Z716 Tobacco abuse counseling: Secondary | ICD-10-CM

## 2016-04-25 DIAGNOSIS — R5382 Chronic fatigue, unspecified: Secondary | ICD-10-CM

## 2016-04-25 DIAGNOSIS — Z205 Contact with and (suspected) exposure to viral hepatitis: Secondary | ICD-10-CM | POA: Diagnosis not present

## 2016-04-25 DIAGNOSIS — E785 Hyperlipidemia, unspecified: Secondary | ICD-10-CM | POA: Diagnosis not present

## 2016-04-25 MED ORDER — VARENICLINE TARTRATE 0.5 MG X 11 & 1 MG X 42 PO MISC: ORAL | Status: DC

## 2016-04-25 NOTE — Progress Notes (Signed)
Patient ID: Ashley [name], female    DOB: 02-19-1961  Age: 55 y.o. MRN: [id number]  The patient is here for annual physical examination and management of other chronic and acute problems. Last seen 3 years ago.     Has been having some allergic rhinitis with  some congestion  S/p TAH, Needs breast /mammogram  Tobacco abuse: she is Smoking 1/2 pack dAILY.  Quit 3 years ago,  Now motivated to quit smoking since a friend dropped dead in front of her this past weekend during a nigit out. Discussed chantix and wellbutrin   Rare alcohol  Use.   Previous history of insomnia  Now using melatonin when needed    The risk factors are reflected in the social history.  The roster of all physicians providing medical care to patient - is listed in the Snapshot section of the chart.  Activities of daily living:  The patient is 100% independent in all ADLs: dressing, toileting, feeding as well as independent mobility  Home safety : The patient has smoke detectors in the home. They wear seatbelts.  There are no firearms at home. There is no violence in the home.   There is no risks for hepatitis, STDs or HIV. There is no   history of blood transfusion. They have no travel history to infectious disease endemic areas of the world.  The patient has seen their dentist in the last six month. They have seen their eye doctor in the last year. They admit to slight hearing difficulty with regard to whispered voices and some television programs.  They have deferred audiologic testing in the last year.  They do not  have excessive sun exposure. Discussed the need for sun protection: hats, long sleeves and use of sunscreen if there is significant sun exposure.   Diet: the importance of a healthy diet is discussed. They do have a healthy diet.  The benefits of regular aerobic exercise were discussed. She walks 4 times per week ,  20 minutes.   Depression screen: there are no signs or vegative symptoms of depression-  irritability, change in appetite, anhedonia, sadness/tearfullness.  Cognitive assessment: the patient manages all their financial and personal affairs and is actively engaged. They could relate day,date,year and events; recalled 2/3 objects at 3 minutes; performed clock-face test normally.  The following portions of the patient's history were reviewed and updated as appropriate: allergies, current medications, past family history, past medical history,  past surgical history, past social history  and problem list.  Visual acuity was not assessed per patient preference since she has regular follow up with her ophthalmologist. Hearing and body mass index were assessed and reviewed.   During the course of the visit the patient was educated and counseled about appropriate screening and preventive services including : fall prevention , diabetes screening, nutrition counseling, colorectal cancer screening, and recommended immunizations.    CC: The primary encounter diagnosis was Breast cancer screening. Diagnoses of Vitamin D deficiency, Hyperlipidemia, Chronic fatigue, Tobacco abuse counseling, Exposure to hepatitis C, and Encounter for preventive measure were also pertinent to this visit.  [name] has a past medical history of Pleurisy (Jan 2013); Allergy; Plantar fasciitis of left foot; History of migraine headaches; Bursitis (2011); and Benign neoplasm of breast (2011).   She has past surgical history that includes Abdominal hysterectomy (2006); Breast biopsy (Bilateral, [id number]); and Elbow surgery (2005).   Her family history includes Brain cancer (age of onset: 75) in her mother; Cancer (age of onset:  66) in her maternal grandmother; Heart disease in her father and sister.She reports that she quit smoking about 4 years ago. She has never used smokeless tobacco. She reports that she drinks alcohol. She reports that she does not use illicit drugs.  Outpatient Prescriptions Prior to Visit   Medication Sig Dispense Refill  . fexofenadine (ALLEGRA) 180 MG tablet Take 1 tablet (180 mg total) by mouth daily. 90 tablet 1  . ibuprofen (ADVIL,MOTRIN) 800 MG tablet Take 800 mg by mouth every 8 (eight) hours as needed.    . mometasone (NASONEX) 50 MCG/ACT nasal spray 2 sprays in each nostril daily 17 g 12  . Naproxen Sodium (ALEVE PO) Take by mouth.    . Phenylephrine HCl (NASAL DECONGESTANT PE PO) Take 1 tablet by mouth daily.    . Probiotic Product (PROBIOTIC FORMULA PO) Take by mouth. Reported on 04/25/2016    . zolpidem (AMBIEN) 10 MG tablet Take 1 tablet (10 mg total) by mouth at bedtime as needed for sleep. 30 tablet 2  . acyclovir (ZOVIRAX) 400 MG tablet take 1 tablet by mouth twice a day (Patient not taking: Reported on 04/25/2016) 60 tablet 5  . ALPRAZolam (XANAX) 0.25 MG tablet Take 1 tablet (0.25 mg total) by mouth 2 (two) times daily as needed for sleep. (Patient not taking: Reported on 04/25/2016) 60 tablet 1  . estradiol (ESTRACE) 0.1 MG/GM vaginal cream 1 to 2 grams per vaginal every night for 2 weeks , then twice weekly (Patient not taking: Reported on 04/25/2016) 42.5 g 3  . Eszopiclone (ESZOPICLONE) 3 MG TABS Take 1 tablet (3 mg total) by mouth at bedtime. Take immediately before bedtime (Patient not taking: Reported on 04/25/2016) 30 tablet 2  . fluconazole (DIFLUCAN) 150 MG tablet Take 1 tablet (150 mg total) by mouth once. 1 tablet 0  . fluticasone (FLONASE) 50 MCG/ACT nasal spray Place 2 sprays into the nose daily. (Patient not taking: Reported on 04/25/2016) 16 g 6  . nystatin cream (MYCOSTATIN) Apply topically 2 (two) times daily. 30 g 0   No facility-administered medications prior to visit.    Review of Systems   Patient denies headache, fevers, malaise, unintentional weight loss, skin rash, eye pain, sinus congestion and sinus pain, sore throat, dysphagia,  hemoptysis , cough, dyspnea, wheezing, chest pain, palpitations, orthopnea, edema, abdominal pain, nausea,  melena, diarrhea, constipation, flank pain, dysuria, hematuria, urinary  Frequency, nocturia, numbness, tingling, seizures,  Focal weakness, Loss of consciousness,  Tremor, insomnia, depression, anxiety, and suicidal ideation.      Objective:  BP 102/68 mmHg  Pulse 96  Temp(Src) 98.6 F (37 C) (Oral)  Resp 12  Ht 5\' 7"  (1.702 m)  Wt 146 lb 8 oz (66.452 kg)  BMI 22.94 kg/m2  SpO2 98%  Physical Exam   General appearance: alert, cooperative and appears stated age Head: Normocephalic, without obvious abnormality, atraumatic Eyes: conjunctivae/corneas clear. PERRL, EOM's intact. Fundi benign. Ears: normal TM's and external ear canals both ears Nose: Nares normal. Septum midline. Mucosa normal. No drainage or sinus tenderness. Throat: lips, mucosa, and tongue normal; teeth and gums normal Neck: no adenopathy, no carotid bruit, no JVD, supple, symmetrical, trachea midline and thyroid not enlarged, symmetric, no tenderness/mass/nodules Lungs: clear to auscultation bilaterally Breasts: normal appearance, no masses or tenderness Heart: regular rate and rhythm, S1, S2 normal, no murmur, click, rub or gallop Abdomen: soft, non-tender; bowel sounds normal; no masses,  no organomegaly Extremities: extremities normal, atraumatic, no cyanosis or edema Pulses: 2+ and symmetric  Skin: Skin color, texture, turgor normal. No rashes or lesions Neurologic: Alert and oriented X 3, normal strength and tone. Normal symmetric reflexes. Normal coordination and gait.     Assessment & Plan:   Problem List Items Addressed This Visit    Exposure to hepatitis C    Recommended retesting given exposure to husband prirorto his treatment.        Encounter for preventive measure    Annual comprehensive preventive exam was done as well as an evaluation and management of chronic conditions .  During the course of the visit the patient was educated and counseled about appropriate screening and preventive services  including :  diabetes screening, lipid analysis with projected  10 year  risk for CAD , nutrition counseling, breast, cervical and colorectal cancer screening, and recommended immunizations.  Printed recommendations for health maintenance screenings was give      Tobacco abuse counseling    Trial of Chantix discussed,  recommended and accepted,        Other Visit Diagnoses    Breast cancer screening    -  Primary    Relevant Orders    MM DIGITAL SCREENING BILATERAL    Vitamin D deficiency        Relevant Orders    VITAMIN D 25 Hydroxy (Vit-D Deficiency, Fractures) (Completed)    Hyperlipidemia        Relevant Orders    Lipid panel (Completed)    Chronic fatigue        Relevant Orders    Comprehensive metabolic panel (Completed)    CBC with Differential/Platelet (Completed)    TSH (Completed)    HIV antibody (Completed)    Hepatitis C antibody (Completed)       I have discontinued Ms. Boquet's estradiol, acyclovir, Eszopiclone, ALPRAZolam, fluticasone, fluconazole, and nystatin cream. I am also having her start on varenicline. Additionally, I am having her maintain her ibuprofen, Naproxen Sodium (ALEVE PO), Probiotic Product (PROBIOTIC FORMULA PO), mometasone, Phenylephrine HCl (NASAL DECONGESTANT PE PO), zolpidem, fexofenadine, vitamin C with rose hips, and Dextromethorphan-Guaifenesin.  Meds ordered this encounter  Medications  . Ascorbic Acid (VITAMIN C WITH ROSE HIPS) 500 MG tablet    Sig: Take 500 mg by mouth daily.  Marland Kitchen Dextromethorphan-Guaifenesin (TUSSIN DM) 10-100 MG/5ML liquid    Sig: Take 5 mLs by mouth every 12 (twelve) hours.  . varenicline (CHANTIX STARTING MONTH PAK) 0.5 MG X 11 & 1 MG X 42 tablet    Sig: Take one 0.5 mg tablet by mouth once daily for 3 days, then increase to one 0.5 mg tablet twice daily for 4 days, then increase to one 1 mg tablet twice daily.    Dispense:  53 tablet    Refill:  0    Medications Discontinued During This Encounter  Medication  Reason  . fluconazole (DIFLUCAN) 150 MG tablet Completed Course  . nystatin cream (MYCOSTATIN)   . acyclovir (ZOVIRAX) 400 MG tablet   . ALPRAZolam (XANAX) 0.25 MG tablet   . estradiol (ESTRACE) 0.1 MG/GM vaginal cream   . Eszopiclone (ESZOPICLONE) 3 MG TABS   . fluticasone (FLONASE) 50 MCG/ACT nasal spray     Follow-up: No Follow-up on file.   Crecencio Mc, MD

## 2016-04-25 NOTE — Progress Notes (Signed)
Pre-visit discussion using our clinic review tool. No additional management support is needed unless otherwise documented below in the visit note.  

## 2016-04-25 NOTE — Patient Instructions (Addendum)
Check with your insurance about Cologuard vs colonoscopy for colon CA screening  Mammogram ordered I have prescribed chantix for tobacco cessation .  I suggest a minimum of 3 months of use.  Call for the 2nd month and 3rd month refills  Menopause is a normal process in which your reproductive ability comes to an end. This process happens gradually over a span of months to years, usually between the ages of 62 and 31. Menopause is complete when you have missed 12 consecutive menstrual periods. It is important to talk with your health care provider about some of the most common conditions that affect postmenopausal women, such as heart disease, cancer, and bone loss (osteoporosis). Adopting a healthy lifestyle and getting preventive care can help to promote your health and wellness. Those actions can also lower your chances of developing some of these common conditions. WHAT SHOULD I KNOW ABOUT MENOPAUSE? During menopause, you may experience a number of symptoms, such as:  Moderate-to-severe hot flashes.  Night sweats.  Decrease in sex drive.  Mood swings.  Headaches.  Tiredness.  Irritability.  Memory problems.  Insomnia. Choosing to treat or not to treat menopausal changes is an individual decision that you make with your health care provider. WHAT SHOULD I KNOW ABOUT HORMONE REPLACEMENT THERAPY AND SUPPLEMENTS? Hormone therapy products are effective for treating symptoms that are associated with menopause, such as hot flashes and night sweats. Hormone replacement carries certain risks, especially as you become older. If you are thinking about using estrogen or estrogen with progestin treatments, discuss the benefits and risks with your health care provider. WHAT SHOULD I KNOW ABOUT HEART DISEASE AND STROKE? Heart disease, heart attack, and stroke become more likely as you age. This may be due, in part, to the hormonal changes that your body experiences during menopause. These can  affect how your body processes dietary fats, triglycerides, and cholesterol. Heart attack and stroke are both medical emergencies. There are many things that you can do to help prevent heart disease and stroke:  Have your blood pressure checked at least every 1-2 years. High blood pressure causes heart disease and increases the risk of stroke.  If you are 69-81 years old, ask your health care provider if you should take aspirin to prevent a heart attack or a stroke.  Do not use any tobacco products, including cigarettes, chewing tobacco, or electronic cigarettes. If you need help quitting, ask your health care provider.  It is important to eat a healthy diet and maintain a healthy weight.  Be sure to include plenty of vegetables, fruits, low-fat dairy products, and lean protein.  Avoid eating foods that are high in solid fats, added sugars, or salt (sodium).  Get regular exercise. This is one of the most important things that you can do for your health.  Try to exercise for at least 150 minutes each week. The type of exercise that you do should increase your heart rate and make you sweat. This is known as moderate-intensity exercise.  Try to do strengthening exercises at least twice each week. Do these in addition to the moderate-intensity exercise.  Know your numbers.Ask your health care provider to check your cholesterol and your blood glucose. Continue to have your blood tested as directed by your health care provider. WHAT SHOULD I KNOW ABOUT CANCER SCREENING? There are several types of cancer. Take the following steps to reduce your risk and to catch any cancer development as early as possible. Breast Cancer  Practice breast self-awareness.  This means understanding how your breasts normally appear and feel.  It also means doing regular breast self-exams. Let your health care provider know about any changes, no matter how small.  If you are 51 or older, have a clinician do a  breast exam (clinical breast exam or CBE) every year. Depending on your age, family history, and medical history, it may be recommended that you also have a yearly breast X-ray (mammogram).  If you have a family history of breast cancer, talk with your health care provider about genetic screening.  If you are at high risk for breast cancer, talk with your health care provider about having an MRI and a mammogram every year.  Breast cancer (BRCA) gene test is recommended for women who have family members with BRCA-related cancers. Results of the assessment will determine the need for genetic counseling and BRCA1 and for BRCA2 testing. BRCA-related cancers include these types:  Breast. This occurs in males or females.  Ovarian.  Tubal. This may also be called fallopian tube cancer.  Cancer of the abdominal or pelvic lining (peritoneal cancer).  Prostate.  Pancreatic. Cervical, Uterine, and Ovarian Cancer Your health care provider may recommend that you be screened regularly for cancer of the pelvic organs. These include your ovaries, uterus, and vagina. This screening involves a pelvic exam, which includes checking for microscopic changes to the surface of your cervix (Pap test).  For women ages 21-65, health care providers may recommend a pelvic exam and a Pap test every three years. For women ages 35-65, they may recommend the Pap test and pelvic exam, combined with testing for human papilloma virus (HPV), every five years. Some types of HPV increase your risk of cervical cancer. Testing for HPV may also be done on women of any age who have unclear Pap test results.  Other health care providers may not recommend any screening for nonpregnant women who are considered low risk for pelvic cancer and have no symptoms. Ask your health care provider if a screening pelvic exam is right for you.  If you have had past treatment for cervical cancer or a condition that could lead to cancer, you need  Pap tests and screening for cancer for at least 20 years after your treatment. If Pap tests have been discontinued for you, your risk factors (such as having a new sexual partner) need to be reassessed to determine if you should start having screenings again. Some women have medical problems that increase the chance of getting cervical cancer. In these cases, your health care provider may recommend that you have screening and Pap tests more often.  If you have a family history of uterine cancer or ovarian cancer, talk with your health care provider about genetic screening.  If you have vaginal bleeding after reaching menopause, tell your health care provider.  There are currently no reliable tests available to screen for ovarian cancer. Lung Cancer Lung cancer screening is recommended for adults 72-35 years old who are at high risk for lung cancer because of a history of smoking. A yearly low-dose CT scan of the lungs is recommended if you:  Currently smoke.  Have a history of at least 30 pack-years of smoking and you currently smoke or have quit within the past 15 years. A pack-year is smoking an average of one pack of cigarettes per day for one year. Yearly screening should:  Continue until it has been 15 years since you quit.  Stop if you develop a health problem  that would prevent you from having lung cancer treatment. Colorectal Cancer  This type of cancer can be detected and can often be prevented.  Routine colorectal cancer screening usually begins at age 67 and continues through age 78.  If you have risk factors for colon cancer, your health care provider may recommend that you be screened at an earlier age.  If you have a family history of colorectal cancer, talk with your health care provider about genetic screening.  Your health care provider may also recommend using home test kits to check for hidden blood in your stool.  A small camera at the end of a tube can be used to  examine your colon directly (sigmoidoscopy or colonoscopy). This is done to check for the earliest forms of colorectal cancer.  Direct examination of the colon should be repeated every 5-10 years until age 29. However, if early forms of precancerous polyps or small growths are found or if you have a family history or genetic risk for colorectal cancer, you may need to be screened more often. Skin Cancer  Check your skin from head to toe regularly.  Monitor any moles. Be sure to tell your health care provider:  About any new moles or changes in moles, especially if there is a change in a mole's shape or color.  If you have a mole that is larger than the size of a pencil eraser.  If any of your family members has a history of skin cancer, especially at a young age, talk with your health care provider about genetic screening.  Always use sunscreen. Apply sunscreen liberally and repeatedly throughout the day.  Whenever you are outside, protect yourself by wearing long sleeves, pants, a wide-brimmed hat, and sunglasses. WHAT SHOULD I KNOW ABOUT OSTEOPOROSIS? Osteoporosis is a condition in which bone destruction happens more quickly than new bone creation. After menopause, you may be at an increased risk for osteoporosis. To help prevent osteoporosis or the bone fractures that can happen because of osteoporosis, the following is recommended:  If you are 67-40 years old, get at least 1,000 mg of calcium and at least 600 mg of vitamin D per day.  If you are older than age 49 but younger than age 26, get at least 1,200 mg of calcium and at least 600 mg of vitamin D per day.  If you are older than age 78, get at least 1,200 mg of calcium and at least 800 mg of vitamin D per day. Smoking and excessive alcohol intake increase the risk of osteoporosis. Eat foods that are rich in calcium and vitamin D, and do weight-bearing exercises several times each week as directed by your health care provider. WHAT  SHOULD I KNOW ABOUT HOW MENOPAUSE AFFECTS [facility]? Depression may occur at any age, but it is more common as you become older. Common symptoms of depression include:  Low or sad mood.  Changes in sleep patterns.  Changes in appetite or eating patterns.  Feeling an overall lack of motivation or enjoyment of activities that you previously enjoyed.  Frequent crying spells. Talk with your health care provider if you think that you are experiencing depression. WHAT SHOULD I KNOW ABOUT IMMUNIZATIONS? It is important that you get and maintain your immunizations. These include:  Tetanus, diphtheria, and pertussis (Tdap) booster vaccine.  Influenza every year before the flu season begins.  Pneumonia vaccine.  Shingles vaccine. Your health care provider may also recommend other immunizations.   This information is  not intended to replace advice given to you by your health care provider. Make sure you discuss any questions you have with your health care provider.   Document Released: 01/04/2006 Document Revised: 12/03/2014 Document Reviewed: 07/15/2014 Elsevier Interactive Patient Education [employer].

## 2016-04-26 LAB — LIPID PANEL
CHOL/HDL RATIO: 4
Cholesterol: 227 mg/dL — ABNORMAL HIGH (ref 0–200)
HDL: 51.5 mg/dL (ref >39.00)
NONHDL: 175.62
Triglycerides: 207 mg/dL — ABNORMAL HIGH (ref 0.0–149.0)
VLDL: 41.4 mg/dL — ABNORMAL HIGH (ref 0.0–40.0)

## 2016-04-26 LAB — CBC WITH DIFFERENTIAL/PLATELET
BASOS PCT: 1.7 % (ref 0.0–3.0)
Basophils Absolute: 0.2 10*3/uL — ABNORMAL HIGH (ref 0.0–0.1)
EOS PCT: 2.3 % (ref 0.0–5.0)
Eosinophils Absolute: 0.3 10*3/uL (ref 0.0–0.7)
HEMATOCRIT: 40.4 % (ref 36.0–46.0)
HEMOGLOBIN: 13.5 g/dL (ref 12.0–15.0)
LYMPHS PCT: 12.3 % (ref 12.0–46.0)
Lymphs Abs: 1.6 10*3/uL (ref 0.7–4.0)
MCHC: 33.4 g/dL (ref 30.0–36.0)
MCV: 97.2 fl (ref 78.0–100.0)
Monocytes Absolute: 0.5 10*3/uL (ref 0.1–1.0)
Monocytes Relative: 3.6 % (ref 3.0–12.0)
Neutro Abs: 10.4 10*3/uL — ABNORMAL HIGH (ref 1.4–7.7)
Neutrophils Relative %: 80.1 % — ABNORMAL HIGH (ref 43.0–77.0)
Platelets: 219 10*3/uL (ref 150.0–400.0)
RBC: 4.16 Mil/uL (ref 3.87–5.11)
RDW: 14.3 % (ref 11.5–15.5)
WBC: 13 10*3/uL — AB (ref 4.0–10.5)

## 2016-04-26 LAB — COMPREHENSIVE METABOLIC PANEL
ALBUMIN: 4.4 g/dL (ref 3.5–5.2)
ALK PHOS: 96 U/L (ref 39–117)
ALT: 11 U/L (ref 0–35)
AST: 17 U/L (ref 0–37)
BILIRUBIN TOTAL: 0.4 mg/dL (ref 0.2–1.2)
BUN: 17 mg/dL (ref 6–23)
CALCIUM: 9.8 mg/dL (ref 8.4–10.5)
CO2: 30 mEq/L (ref 19–32)
Chloride: 105 mEq/L (ref 96–112)
Creatinine, Ser: 1.08 mg/dL (ref 0.40–1.20)
GFR: 56.16 mL/min — AB (ref >60.00)
Glucose, Bld: 81 mg/dL (ref 70–99)
Potassium: 4.9 mEq/L (ref 3.5–5.1)
Sodium: 141 mEq/L (ref 135–145)
TOTAL PROTEIN: 6.7 g/dL (ref 6.0–8.3)

## 2016-04-26 LAB — LDL CHOLESTEROL, DIRECT: Direct LDL: 145 mg/dL

## 2016-04-26 LAB — HEPATITIS C ANTIBODY: HCV AB: NEGATIVE

## 2016-04-26 LAB — VITAMIN D 25 HYDROXY (VIT D DEFICIENCY, FRACTURES): VITD: 32.96 ng/mL (ref 30.00–100.00)

## 2016-04-26 LAB — HIV ANTIBODY (ROUTINE TESTING W REFLEX): HIV: NONREACTIVE

## 2016-04-26 LAB — TSH: TSH: 1.1 u[IU]/mL (ref 0.35–4.50)

## 2016-04-27 ENCOUNTER — Encounter: Payer: Self-pay | Admitting: Internal Medicine

## 2016-04-27 NOTE — Assessment & Plan Note (Signed)
Recommended retesting given exposure to husband prirorto his treatment.

## 2016-04-27 NOTE — Assessment & Plan Note (Signed)
Trial of Chantix discussed,  recommended and accepted,

## 2016-04-27 NOTE — Assessment & Plan Note (Signed)
Annual comprehensive preventive exam was done as well as an evaluation and management of chronic conditions .  During the course of the visit the patient was educated and counseled about appropriate screening and preventive services including :  diabetes screening, lipid analysis with projected  10 year  risk for CAD , nutrition counseling, breast, cervical and colorectal cancer screening, and recommended immunizations.  Printed recommendations for health maintenance screenings was give 

## 2016-05-09 ENCOUNTER — Telehealth: Payer: Self-pay | Admitting: Internal Medicine

## 2016-05-09 NOTE — Telephone Encounter (Signed)
Pt has been unable to sign into her mychart. She would like a call with her lab results.

## 2016-05-09 NOTE — Telephone Encounter (Signed)
Patient is aware of lab results.

## 2016-05-11 NOTE — Telephone Encounter (Signed)
Mailed unread message to patient. thanks 

## 2016-05-30 ENCOUNTER — Ambulatory Visit
Admission: RE | Admit: 2016-05-30 | Discharge: 2016-05-30 | Disposition: A | Discharge status: Home / Self Care | Payer: 59 | Source: Ambulatory Visit | Attending: Internal Medicine | Admitting: Internal Medicine

## 2016-05-30 DIAGNOSIS — Z1239 Encounter for other screening for malignant neoplasm of breast: Secondary | ICD-10-CM

## 2016-05-30 DIAGNOSIS — Z1231 Encounter for screening mammogram for malignant neoplasm of breast: Secondary | ICD-10-CM | POA: Insufficient documentation

## 2016-05-31 ENCOUNTER — Telehealth: Payer: Self-pay

## 2016-05-31 NOTE — Telephone Encounter (Signed)
PA for Chantix completed on Cover my meds

## 2016-06-04 NOTE — Telephone Encounter (Signed)
PA denied as patient has no documented trials of Habitrol, Nicoderm (any type),or thrive products OTC.  Patient also and a trial for intolerance to bupropion.  Please advise. Thanks

## 2016-06-05 NOTE — Telephone Encounter (Signed)
I spoke with the patient, she had tried the nicoderm patch prior and it made her break out in a adhesive rash and then she stated that years ago she tried bupropion and it didn't work, so I am going to resubmit the PA. Thanks

## 2016-06-05 NOTE — Telephone Encounter (Signed)
Discuss with patient which ones she is willing to try or has tried in the past and not told us about.

## 2016-06-06 NOTE — Telephone Encounter (Signed)
Resent to plan, 06/06/16 on cover my meds.

## 2016-06-07 NOTE — Telephone Encounter (Signed)
PA approved until 06/06/2017.

## 2016-07-18 ENCOUNTER — Telehealth: Payer: Self-pay | Admitting: Internal Medicine

## 2016-07-18 NOTE — Telephone Encounter (Signed)
With Chantix,  You have to ask the patient if she is "starting over" and nends the starting pack,  Or the continuining pack

## 2016-07-18 NOTE — Telephone Encounter (Signed)
Refill request for Chantix, last seen [id number], last filled [id number].  Please advise.

## 2016-07-18 NOTE — Telephone Encounter (Signed)
pharmacy called pt needs a Rx for Chantix continuing.. Please advise [facility] with any question

## 2016-07-19 MED ORDER — VARENICLINE TARTRATE 1 MG PO TABS: 1.0000 mg | ORAL_TABLET | Freq: Two times a day (BID) | ORAL | 3 refills | Status: DC

## 2016-07-19 MED ORDER — VARENICLINE TARTRATE 0.5 MG X 11 & 1 MG X 42 PO MISC: ORAL | 0 refills | Status: DC

## 2016-07-19 NOTE — Telephone Encounter (Signed)
Spoke to patient and she stated that it was the continuing pack but she has finished the starter Two weeks ago.  Will she have to start over? Please advise.

## 2016-07-19 NOTE — Telephone Encounter (Signed)
Patient has been notified

## 2016-07-19 NOTE — Telephone Encounter (Signed)
She should start over to avoid side effects .  Then i'll also send the continuining pack to take for 3 months

## 2017-01-25 ENCOUNTER — Telehealth: Payer: Self-pay | Admitting: *Deleted

## 2017-01-25 NOTE — Telephone Encounter (Signed)
Rec fax stating Chantix is approved until 01/24/18. Left detailed mess informing pt.

## 2017-04-30 ENCOUNTER — Other Ambulatory Visit: Payer: Self-pay | Admitting: Internal Medicine

## 2017-10-23 ENCOUNTER — Telehealth: Payer: Self-pay

## 2017-10-23 DIAGNOSIS — Z1239 Encounter for other screening for malignant neoplasm of breast: Secondary | ICD-10-CM

## 2017-10-23 NOTE — Telephone Encounter (Signed)
Ok to place orders for screening? Last mammogram looks like it was 05/30/2016   Copied from [place] [phone]. Topic: Referral - Request >> Oct 23, 2017  3:42 PM Lara Mulch, [place], [place] wrote: Reason for [id number] would like to know if her doctor could give her a referral to get a a mammogram done. If someone from the offic could give her a call back about this matter

## 2017-10-24 NOTE — Addendum Note (Signed)
Addended by: Crecencio Mc on: 10/24/2017 02:05 PM   Modules accepted: Orders

## 2017-10-24 NOTE — Telephone Encounter (Signed)
Left message to call back. CRM started

## 2017-10-24 NOTE — Telephone Encounter (Signed)
Mammogram ordered in the future you can order without asking

## 2017-10-25 ENCOUNTER — Telehealth: Payer: Self-pay

## 2017-10-25 DIAGNOSIS — Z1239 Encounter for other screening for malignant neoplasm of breast: Secondary | ICD-10-CM

## 2017-10-25 NOTE — Telephone Encounter (Signed)
Left message for [facility] to call back so I can schedule mammogram  Copied from [place] [phone]. Topic: Quick Communication - See Telephone Encounter >> Oct 24, 2017  2:10 PM Lars Masson, LPN wrote: CRM for notification. See Telephone encounter for:  10/24/17. >> Oct 24, 2017  2:43 PM Boyd Kerbs wrote: Patient said if could schedule with [facility] on a Wed. After 1

## 2017-10-30 NOTE — Telephone Encounter (Signed)
Mammogram has been scheduled for 12/04/2016 @ 1:20pm. Pt is aware of appt date and time.

## 2017-12-04 ENCOUNTER — Ambulatory Visit
Admission: RE | Admit: 2017-12-04 | Discharge: 2017-12-04 | Disposition: A | Discharge status: Home / Self Care | Payer: 59 | Source: Ambulatory Visit | Attending: Internal Medicine | Admitting: Internal Medicine

## 2017-12-04 DIAGNOSIS — Z1231 Encounter for screening mammogram for malignant neoplasm of breast: Secondary | ICD-10-CM | POA: Insufficient documentation

## 2017-12-04 DIAGNOSIS — Z1239 Encounter for other screening for malignant neoplasm of breast: Secondary | ICD-10-CM

## 2017-12-11 ENCOUNTER — Encounter: Payer: Self-pay | Admitting: Internal Medicine

## 2017-12-11 ENCOUNTER — Ambulatory Visit (INDEPENDENT_AMBULATORY_CARE_PROVIDER_SITE_OTHER): Payer: 59 | Admitting: Internal Medicine

## 2017-12-11 VITALS — BP 122/78 | HR 84 | Temp 98.3°F | Resp 16 | Ht 67.0 in | Wt 179.8 lb

## 2017-12-11 DIAGNOSIS — Z0001 Encounter for general adult medical examination with abnormal findings: Secondary | ICD-10-CM | POA: Diagnosis not present

## 2017-12-11 DIAGNOSIS — N951 Menopausal and female climacteric states: Secondary | ICD-10-CM

## 2017-12-11 DIAGNOSIS — Z299 Encounter for prophylactic measures, unspecified: Secondary | ICD-10-CM | POA: Diagnosis not present

## 2017-12-11 DIAGNOSIS — R5383 Other fatigue: Secondary | ICD-10-CM

## 2017-12-11 DIAGNOSIS — E669 Obesity, unspecified: Secondary | ICD-10-CM

## 2017-12-11 DIAGNOSIS — Z87891 Personal history of nicotine dependence: Secondary | ICD-10-CM | POA: Diagnosis not present

## 2017-12-11 DIAGNOSIS — E785 Hyperlipidemia, unspecified: Secondary | ICD-10-CM

## 2017-12-11 DIAGNOSIS — N952 Postmenopausal atrophic vaginitis: Secondary | ICD-10-CM

## 2017-12-11 MED ORDER — VALACYCLOVIR HCL 1 G PO TABS: 1000.0000 mg | ORAL_TABLET | Freq: Two times a day (BID) | ORAL | 5 refills | Status: DC

## 2017-12-11 NOTE — Patient Instructions (Addendum)
You might want to try a premixed protein drink called Premier Protein shake for breakfast or late night snack . It is great tasting,   very low sugar and available of < $2 serving at [facility] and  In bulk for $1.50/serving at [facility] Maintenance for Postmenopausal Women Menopause is a normal process in which your reproductive ability comes to an end. This process happens gradually over a span of months to years, usually between the ages of 22 and 53. Menopause is complete when you have missed 12 consecutive menstrual periods. It is important to talk with your health care provider about some of the most common conditions that affect postmenopausal women, such as heart disease, cancer, and bone loss (osteoporosis). Adopting a healthy lifestyle and getting preventive care can help to promote your health and wellness. Those actions can also lower your chances of developing some of these common conditions. What should I know about menopause? During menopause, you may experience a number of symptoms, such as:  Moderate-to-severe hot flashes.  Night sweats.  Decrease in sex drive.  Mood swings.  Headaches.  Tiredness.  Irritability.  Memory problems.  Insomnia.  Choosing to treat or not to treat menopausal changes is an individual decision that you make with your health care provider. What should I know about hormone replacement therapy and supplements? Hormone therapy products are effective for treating symptoms that are associated with menopause, such as hot flashes and night sweats. Hormone replacement carries certain risks, especially as you become older. If you are thinking about using estrogen or estrogen with progestin treatments, discuss the benefits and risks with your health care provider. What should I know about heart disease and stroke? Heart disease, heart attack, and stroke become more likely as you age. This may be due, in part, to the hormonal changes that your  body experiences during menopause. These can affect how your body processes dietary fats, triglycerides, and cholesterol. Heart attack and stroke are both medical emergencies. There are many things that you can do to help prevent heart disease and stroke:  Have your blood pressure checked at least every 1-2 years. High blood pressure causes heart disease and increases the risk of stroke.  If you are 70-29 years old, ask your health care provider if you should take aspirin to prevent a heart attack or a stroke.  Do not use any tobacco products, including cigarettes, chewing tobacco, or electronic cigarettes. If you need help quitting, ask your health care provider.  It is important to eat a healthy diet and maintain a healthy weight. ? Be sure to include plenty of vegetables, fruits, low-fat dairy products, and lean protein. ? Avoid eating foods that are high in solid fats, added sugars, or salt (sodium).  Get regular exercise. This is one of the most important things that you can do for your health. ? Try to exercise for at least 150 minutes each week. The type of exercise that you do should increase your heart rate and make you sweat. This is known as moderate-intensity exercise. ? Try to do strengthening exercises at least twice each week. Do these in addition to the moderate-intensity exercise.  Know your numbers.Ask your health care provider to check your cholesterol and your blood glucose. Continue to have your blood tested as directed by your health care provider.  What should I know about cancer screening? There are several types of cancer. Take the following steps to reduce your risk and to catch any  cancer development as early as possible. Breast Cancer  Practice breast self-awareness. ? This means understanding how your breasts normally appear and feel. ? It also means doing regular breast self-exams. Let your health care provider know about any changes, no matter how small.  If  you are 35 or older, have a clinician do a breast exam (clinical breast exam or CBE) every year. Depending on your age, family history, and medical history, it may be recommended that you also have a yearly breast X-ray (mammogram).  If you have a family history of breast cancer, talk with your health care provider about genetic screening.  If you are at high risk for breast cancer, talk with your health care provider about having an MRI and a mammogram every year.  Breast cancer (BRCA) gene test is recommended for women who have family members with BRCA-related cancers. Results of the assessment will determine the need for genetic counseling and BRCA1 and for BRCA2 testing. BRCA-related cancers include these types: ? Breast. This occurs in males or females. ? Ovarian. ? Tubal. This may also be called fallopian tube cancer. ? Cancer of the abdominal or pelvic lining (peritoneal cancer). ? Prostate. ? Pancreatic.  Cervical, Uterine, and Ovarian Cancer Your health care provider may recommend that you be screened regularly for cancer of the pelvic organs. These include your ovaries, uterus, and vagina. This screening involves a pelvic exam, which includes checking for microscopic changes to the surface of your cervix (Pap test).  For women ages 21-65, health care providers may recommend a pelvic exam and a Pap test every three years. For women ages 40-65, they may recommend the Pap test and pelvic exam, combined with testing for human papilloma virus (HPV), every five years. Some types of HPV increase your risk of cervical cancer. Testing for HPV may also be done on women of any age who have unclear Pap test results.  Other health care providers may not recommend any screening for nonpregnant women who are considered low risk for pelvic cancer and have no symptoms. Ask your health care provider if a screening pelvic exam is right for you.  If you have had past treatment for cervical cancer or a  condition that could lead to cancer, you need Pap tests and screening for cancer for at least 20 years after your treatment. If Pap tests have been discontinued for you, your risk factors (such as having a new sexual partner) need to be reassessed to determine if you should start having screenings again. Some women have medical problems that increase the chance of getting cervical cancer. In these cases, your health care provider may recommend that you have screening and Pap tests more often.  If you have a family history of uterine cancer or ovarian cancer, talk with your health care provider about genetic screening.  If you have vaginal bleeding after reaching menopause, tell your health care provider.  There are currently no reliable tests available to screen for ovarian cancer.  Lung Cancer Lung cancer screening is recommended for adults 80-22 years old who are at high risk for lung cancer because of a history of smoking. A yearly low-dose CT scan of the lungs is recommended if you:  Currently smoke.  Have a history of at least 30 pack-years of smoking and you currently smoke or have quit within the past 15 years. A pack-year is smoking an average of one pack of cigarettes per day for one year.  Yearly screening should:  Continue until  it has been 15 years since you quit.  Stop if you develop a health problem that would prevent you from having lung cancer treatment.  Colorectal Cancer  This type of cancer can be detected and can often be prevented.  Routine colorectal cancer screening usually begins at age 17 and continues through age 7.  If you have risk factors for colon cancer, your health care provider may recommend that you be screened at an earlier age.  If you have a family history of colorectal cancer, talk with your health care provider about genetic screening.  Your health care provider may also recommend using home test kits to check for hidden blood in your stool.  A  small camera at the end of a tube can be used to examine your colon directly (sigmoidoscopy or colonoscopy). This is done to check for the earliest forms of colorectal cancer.  Direct examination of the colon should be repeated every 5-10 years until age 31. However, if early forms of precancerous polyps or small growths are found or if you have a family history or genetic risk for colorectal cancer, you may need to be screened more often.  Skin Cancer  Check your skin from head to toe regularly.  Monitor any moles. Be sure to tell your health care provider: ? About any new moles or changes in moles, especially if there is a change in a mole's shape or color. ? If you have a mole that is larger than the size of a pencil eraser.  If any of your family members has a history of skin cancer, especially at a young age, talk with your health care provider about genetic screening.  Always use sunscreen. Apply sunscreen liberally and repeatedly throughout the day.  Whenever you are outside, protect yourself by wearing long sleeves, pants, a wide-brimmed hat, and sunglasses.  What should I know about osteoporosis? Osteoporosis is a condition in which bone destruction happens more quickly than new bone creation. After menopause, you may be at an increased risk for osteoporosis. To help prevent osteoporosis or the bone fractures that can happen because of osteoporosis, the following is recommended:  If you are 90-10 years old, get at least 1,000 mg of calcium and at least 600 mg of vitamin D per day.  If you are older than age 53 but younger than age 61, get at least 1,200 mg of calcium and at least 600 mg of vitamin D per day.  If you are older than age 82, get at least 1,200 mg of calcium and at least 800 mg of vitamin D per day.  Smoking and excessive alcohol intake increase the risk of osteoporosis. Eat foods that are rich in calcium and vitamin D, and do weight-bearing exercises several times  each week as directed by your health care provider. What should I know about how menopause affects my mental health? Depression may occur at any age, but it is more common as you become older. Common symptoms of depression include:  Low or sad mood.  Changes in sleep patterns.  Changes in appetite or eating patterns.  Feeling an overall lack of motivation or enjoyment of activities that you previously enjoyed.  Frequent crying spells.  Talk with your health care provider if you think that you are experiencing depression. What should I know about immunizations? It is important that you get and maintain your immunizations. These include:  Tetanus, diphtheria, and pertussis (Tdap) booster vaccine.  Influenza every year before the flu season  begins.  Pneumonia vaccine.  Shingles vaccine.  Your health care provider may also recommend other immunizations. This information is not intended to replace advice given to you by your health care provider. Make sure you discuss any questions you have with your health care provider. Document Released: 01/04/2006 Document Revised: 06/01/2016 Document Reviewed: 08/16/2015 Elsevier Interactive Patient Education  2018 [employer]  .    Nutritional analysis :  160 cal  30 g protein  1 g sugar 50% calcium needs   Vladimir Faster and [employer]   Try  " Headspace" app  On your I phone to help you relax and control your mind  Check with your insurance about Cologuard coverage vs colonoscopy

## 2017-12-11 NOTE — Progress Notes (Signed)
Patient ID: Ashley [name], female    DOB: [birth year]  Age: 57 y.o. MRN: [id number]  The patient is here for annual preventive  examination and management of other chronic and acute problems.    Ashley [name] had Ashley [name] mammogram last week; BIRADS 1 S/p TAH for precancerous cervix  In 2006.  Defers vaginal PAP smear  No prior  Colonoscopy. Did not complete he cologuard, lost it.  Not sure if it is covered,  No prior influenza vaccination, apprehensive .  Ashley [name] has deferred again.     The risk factors are reflected in the social history.  The roster of all physicians providing medical care to patient - is listed in the Snapshot section of the chart.  Activities of daily living:  The patient is 100% independent in all ADLs: dressing, toileting, feeding as well as independent mobility  Home safety : The patient has smoke detectors in the home. They wear seatbelts.  There are no firearms at home. There is no violence in the home.   There is no risks for hepatitis, STDs or HIV. There is no   history of blood transfusion. They have no travel history to infectious disease endemic areas of the world.  The patient has seen their dentist in the last six month. They have seen their eye doctor in the last year. They admit to slight hearing difficulty with regard to whispered voices and some television programs.  They have deferred audiologic testing in the last year.  They do not  have excessive sun exposure. Discussed the need for sun protection: hats, long sleeves and use of sunscreen if there is significant sun exposure.   Diet: the importance of a healthy diet is discussed. They do have a healthy diet.  The benefits of regular aerobic exercise were discussed. Ashley [name] walks 4 times per week ,  20 minutes.   Depression screen: there are no signs or vegative symptoms of depression- irritability, change in appetite, anhedonia, sadness/tearfullness.  The following portions of the patient's history were reviewed and updated  as appropriate: allergies, current medications, past family history, past medical history,  past surgical history, past social history  and problem list.  Visual acuity was not assessed per patient preference since Ashley [name] has regular follow up with Ashley [name] ophthalmologist. Hearing and body mass index were assessed and reviewed.   During the course of the visit the patient was educated and counseled about appropriate screening and preventive services including : fall prevention , diabetes screening, nutrition counseling, colorectal cancer screening, and recommended immunizations.    CC: The primary encounter diagnosis was Fatigue, unspecified type. Diagnoses of Hyperlipidemia LDL goal <100, Menopause syndrome, History of tobacco abuse, Postmenopausal atrophic vaginitis, Encounter for preventive measure, and Obesity (BMI 30.0-34.9) were also pertinent to this visit.  Tobacco abuse:  Quit smoking Feb 2018 with chantix last year  Took it for 4  Months   Obesity:  Ashley [name] has had a weight  gain of 33 lbs  Since last visit 18 months ago. Discussed current diet and lack of regular participation in exercise.  Ashley [name] efforts are  limited by small apartment only recently vacated by several family members who were living with Ashley [name],  And an extremely limited income with a budget of $100/month for food , gas and incidentals .  H Sugar intake HIGH sweet tea,  Tater tots,   Sweet n sour chicken frozen dinners.  carnation instant breakfast drink made with milk,  Granola bars  Insomnia:  Frequent wakenings.  Nocturia x 2-3,  Hot flashes . Using melatonin when Ashley [name] remembers.   Atrophic vaginitis can't afford estrogen,  Using replens  Available OTC at $14/month  vs $30.   Persistent sinus congestion,  Chronic,  Uses antihistamine and saline irrigation    [name] has a past medical history of Allergy, Benign neoplasm of breast (2011), Bursitis (2011), History of migraine headaches, Plantar fasciitis of left foot, and  Pleurisy (Jan 2013).   Ashley [name] has a past surgical history that includes Abdominal hysterectomy (2006); Elbow surgery (2005); and Breast biopsy (Bilateral, 4403,4742).   Ashley [name] family history includes Brain cancer (age of onset: 71) in Ashley [name] mother; Breast cancer (age of onset: 68) in Ashley [name] maternal grandmother; Cancer (age of onset: 63) in Ashley [name] maternal grandmother; Heart disease in Ashley [name] father and sister.Ashley [name] reports that Ashley [name] quit smoking about 6 years ago. Ashley [name] has never used smokeless tobacco. Ashley [name] reports that Ashley [name] drinks alcohol. Ashley [name] reports that Ashley [name] does not use drugs.  Outpatient Medications Prior to Visit  Medication Sig Dispense Refill  . Ascorbic Acid (VITAMIN C WITH ROSE HIPS) 500 MG tablet Take 500 mg by mouth daily.    . fexofenadine (ALLEGRA) 180 MG tablet Take 1 tablet (180 mg total) by mouth daily. 90 tablet 1  . ibuprofen (ADVIL,MOTRIN) 800 MG tablet Take 800 mg by mouth every 8 (eight) hours as needed.    . Naproxen Sodium (ALEVE PO) Take by mouth.    . Phenylephrine HCl (NASAL DECONGESTANT PE PO) Take 1 tablet by mouth daily.    Hendricks Limes CONTINUING MONTH PAK 1 MG tablet TAKE (1) TABLET BY MOUTH TWICE DAILY (Patient not taking: Reported on 12/11/2017) 56 tablet 1  . Dextromethorphan-Guaifenesin (TUSSIN DM) 10-100 MG/5ML liquid Take 5 mLs by mouth every 12 (twelve) hours.    . mometasone (NASONEX) 50 MCG/ACT nasal spray 2 sprays in each nostril daily (Patient not taking: Reported on 12/11/2017) 17 g 12  . Probiotic Product (PROBIOTIC FORMULA PO) Take by mouth. Reported on 04/25/2016    . varenicline (CHANTIX STARTING MONTH PAK) 0.5 MG X 11 & 1 MG X 42 tablet Take one 0.5 mg tablet by mouth once daily for 3 days, then increase to one 0.5 mg tablet twice daily for 4 days, then increase to one 1 mg tablet twice daily. (Patient not taking: Reported on 12/11/2017) 53 tablet 0  . zolpidem (AMBIEN) 10 MG tablet Take 1 tablet (10 mg total) by mouth at bedtime as needed for sleep. 30 tablet 2   No  facility-administered medications prior to visit.     Review of Systems   Patient denies headache, fevers, malaise, unintentional weight loss, skin rash, eye pain,sinus pain, sore throat, dysphagia,  hemoptysis , cough, dyspnea, wheezing, chest pain, palpitations, orthopnea, edema, abdominal pain, nausea, melena, diarrhea, constipation, flank pain, dysuria, hematuria, urinary  Frequency, numbness, tingling, seizures,  Focal weakness, Loss of consciousness,  Tremor, insomnia, depression, anxiety, and suicidal ideation.      Objective:  BP 122/78 (BP Location: Right Arm, Patient Position: Sitting, Cuff Size: Normal)   Pulse 84   Temp 98.3 F (36.8 C) (Oral)   Resp 16   Ht 5\' 7"  (1.702 m)   Wt 179 lb 12.8 oz (81.6 kg)   SpO2 98%   BMI 28.16 kg/m   Physical Exam   General appearance: alert, cooperative and appears stated age Ears: normal TM's and external ear canals both ears Throat: lips, mucosa, and tongue normal; teeth and gums normal Neck: no adenopathy,  no carotid bruit, supple, symmetrical, trachea midline and thyroid not enlarged, symmetric, no tenderness/mass/nodules Back: symmetric, no curvature. ROM normal. No CVA tenderness. Lungs: clear to auscultation bilaterally Heart: regular rate and rhythm, S1, S2 normal, no murmur, click, rub or gallop Abdomen: soft, non-tender; bowel sounds normal; no masses,  no organomegaly Pulses: 2+ and symmetric Skin: Skin color, texture, turgor normal. No rashes or lesions Lymph nodes: Cervical, supraclavicular, and axillary nodes normal.    Assessment & Plan:   Problem List Items Addressed This Visit    Encounter for preventive measure    Annual comprehensive preventive exam was done as well as an evaluation and management of chronic conditions .  During the course of the visit the patient was educated and counseled about appropriate screening and preventive services including :  diabetes screening, lipid analysis with projected  10 year   risk for CAD  , nutrition counseling, colorectal cancer screening, and recommended immunizations.  Printed recommendations for health maintenance screenings was given.  Ashley [name] will return for fasting labs        History of tobacco abuse    Ashley [name] has been tobacco free since February 2018 with the help of chantix , which Ashley [name] d/c'd after 4 months       Menopause syndrome    Ashley [name] is s/p TAH remotely and has been tobacco free for 18 months.  Will offer estradiol as an alternative to Replens      Obesity (BMI 30.0-34.9)    I have addressed  BMI and recommended wt loss of 10% of body weigh over the next 6 months using a low glycemic index diet and regular exercise a minimum of 5 days per week.        Postmenopausal atrophic vaginitis    Chronic, managed with Replens due to cost of premarin.  Given Ashley [name] tobacco cessation abd prior TAH.  Will offer estradiol         Other Visit Diagnoses    Fatigue, unspecified type    -  Primary   Relevant Orders   Comprehensive metabolic panel   CBC with Differential/Platelet   TSH   Hyperlipidemia LDL goal <100       Relevant Orders   Lipid panel      I have discontinued Karen Chafe. [place] "Janine"'s Probiotic Product (PROBIOTIC FORMULA PO), mometasone, zolpidem, Dextromethorphan-Guaifenesin, varenicline, and CHANTIX CONTINUING MONTH PAK. I am also having Ashley [name] start on valACYclovir. Additionally, I am having Ashley [name] maintain Ashley [name] ibuprofen, Naproxen Sodium (ALEVE PO), Phenylephrine HCl (NASAL DECONGESTANT PE PO), fexofenadine, and vitamin C with rose hips.  Meds ordered this encounter  Medications  . valACYclovir (VALTREX) 1000 MG tablet    Sig: Take 1 tablet (1,000 mg total) by mouth 2 (two) times daily. For one week,  Then daily thereafter    Dispense:  31 tablet    Refill:  5    Medications Discontinued During This Encounter  Medication Reason  . CHANTIX CONTINUING MONTH PAK 1 MG tablet Patient has not taken in last 30 days  . Dextromethorphan-Guaifenesin  (TUSSIN DM) 10-100 MG/5ML liquid Patient has not taken in last 30 days  . mometasone (NASONEX) 50 MCG/ACT nasal spray Patient has not taken in last 30 days  . Probiotic Product (PROBIOTIC FORMULA PO) Patient has not taken in last 30 days  . varenicline (CHANTIX STARTING MONTH PAK) 0.5 MG X 11 & 1 MG X 42 tablet Patient has not taken in last 30 days  . zolpidem (AMBIEN) 10 MG tablet  Patient has not taken in last 30 days    Follow-up: Return in about 1 year (around 12/11/2018).   Crecencio Mc, MD

## 2017-12-12 ENCOUNTER — Telehealth: Payer: Self-pay | Admitting: Internal Medicine

## 2017-12-12 DIAGNOSIS — N951 Menopausal and female climacteric states: Secondary | ICD-10-CM | POA: Insufficient documentation

## 2017-12-12 DIAGNOSIS — E663 Overweight: Secondary | ICD-10-CM | POA: Insufficient documentation

## 2017-12-12 DIAGNOSIS — E669 Obesity, unspecified: Secondary | ICD-10-CM | POA: Insufficient documentation

## 2017-12-12 NOTE — Assessment & Plan Note (Signed)
She is s/p TAH remotely and has been tobacco free for 18 months.  Will offer estradiol as an alternative to Replens

## 2017-12-12 NOTE — Assessment & Plan Note (Signed)
I have addressed  BMI and recommended wt loss of 10% of body weigh over the next 6 months using a low glycemic index diet and regular exercise a minimum of 5 days per week.   

## 2017-12-12 NOTE — Assessment & Plan Note (Signed)
Chronic, managed with Replens due to cost of premarin.  Given her tobacco cessation abd prior TAH.  Will offer estradiol   

## 2017-12-12 NOTE — Assessment & Plan Note (Signed)
Annual comprehensive preventive exam was done as well as an evaluation and management of chronic conditions .  During the course of the visit the patient was educated and counseled about appropriate screening and preventive services including :  diabetes screening, lipid analysis with projected  10 year  risk for CAD  , nutrition counseling, colorectal cancer screening, and recommended immunizations.  Printed recommendations for health maintenance screenings was given.  She will return for fasting labs

## 2017-12-12 NOTE — Assessment & Plan Note (Signed)
She has been tobacco free since February 2018 with the help of chantix , which she d/c'd after 4 months

## 2017-12-12 NOTE — Telephone Encounter (Signed)
plEASE ORDER COloguard,  Thanks

## 2017-12-13 NOTE — Telephone Encounter (Signed)
cologuard order has been faxed.

## 2017-12-14 LAB — CBC WITH DIFFERENTIAL/PLATELET
BASOS: 1 %
Basophils Absolute: 0 10*3/uL (ref 0.0–0.2)
EOS (ABSOLUTE): 0.2 10*3/uL (ref 0.0–0.4)
Eos: 4 %
Hematocrit: 38 % (ref 34.0–46.6)
Hemoglobin: 12.6 g/dL (ref 11.1–15.9)
IMMATURE GRANULOCYTES: 0 %
Immature Grans (Abs): 0 10*3/uL (ref 0.0–0.1)
Lymphocytes Absolute: 1.6 10*3/uL (ref 0.7–3.1)
Lymphs: 34 %
MCH: 30.4 pg (ref 26.6–33.0)
MCHC: 33.2 g/dL (ref 31.5–35.7)
MCV: 92 fL (ref 79–97)
MONOS ABS: 0.4 10*3/uL (ref 0.1–0.9)
Monocytes: 8 %
NEUTROS PCT: 53 %
Neutrophils Absolute: 2.5 10*3/uL (ref 1.4–7.0)
PLATELETS: 264 10*3/uL (ref 150–379)
RBC: 4.15 x10E6/uL (ref 3.77–5.28)
RDW: 14.7 % (ref 12.3–15.4)
WBC: 4.8 10*3/uL (ref 3.4–10.8)

## 2017-12-14 LAB — COMPREHENSIVE METABOLIC PANEL
A/G RATIO: 1.7 (ref 1.2–2.2)
ALK PHOS: 119 IU/L — AB (ref 39–117)
ALT: 30 IU/L (ref 0–32)
AST: 32 IU/L (ref 0–40)
Albumin: 4.3 g/dL (ref 3.5–5.5)
BUN/Creatinine Ratio: 17 (ref 9–23)
BUN: 16 mg/dL (ref 6–24)
Bilirubin Total: 0.3 mg/dL (ref 0.0–1.2)
CALCIUM: 9.4 mg/dL (ref 8.7–10.2)
CO2: 23 mmol/L (ref 20–29)
Chloride: 105 mmol/L (ref 96–106)
Creatinine, Ser: 0.96 mg/dL (ref 0.57–1.00)
GFR calc Af Amer: 77 mL/min/{1.73_m2} (ref >59)
GFR, EST NON AFRICAN AMERICAN: 67 mL/min/{1.73_m2} (ref >59)
GLOBULIN, TOTAL: 2.6 g/dL (ref 1.5–4.5)
Glucose: 82 mg/dL (ref 65–99)
POTASSIUM: 4.9 mmol/L (ref 3.5–5.2)
SODIUM: 143 mmol/L (ref 134–144)
Total Protein: 6.9 g/dL (ref 6.0–8.5)

## 2017-12-14 LAB — LIPID PANEL
Chol/HDL Ratio: 4.7 ratio — ABNORMAL HIGH (ref 0.0–4.4)
Cholesterol, Total: 263 mg/dL — ABNORMAL HIGH (ref 100–199)
HDL: 56 mg/dL (ref >39)
LDL Calculated: 187 mg/dL — ABNORMAL HIGH (ref 0–99)
Triglycerides: 98 mg/dL (ref 0–149)
VLDL Cholesterol Cal: 20 mg/dL (ref 5–40)

## 2017-12-14 LAB — TSH: TSH: 1.75 u[IU]/mL (ref 0.450–4.500)

## 2017-12-15 ENCOUNTER — Encounter: Payer: Self-pay | Admitting: Internal Medicine

## 2017-12-19 ENCOUNTER — Encounter: Payer: Self-pay | Admitting: Internal Medicine

## 2017-12-25 ENCOUNTER — Other Ambulatory Visit: Payer: Self-pay | Admitting: Internal Medicine

## 2017-12-25 MED ORDER — ESTRADIOL 1 MG PO TABS: 1.0000 mg | ORAL_TABLET | Freq: Every day | ORAL | 3 refills | Status: DC

## 2017-12-26 LAB — COLOGUARD: COLOGUARD: NEGATIVE

## 2018-01-09 ENCOUNTER — Encounter: Payer: Self-pay | Admitting: Internal Medicine

## 2018-01-11 ENCOUNTER — Telehealth: Payer: Self-pay | Admitting: Internal Medicine

## 2018-01-11 MED ORDER — ESTRADIOL 2 MG PO TABS: 2.0000 mg | ORAL_TABLET | Freq: Every day | ORAL | 3 refills | Status: DC

## 2018-01-11 NOTE — Telephone Encounter (Signed)
cologuard abstracted and MyChart message sent

## 2018-01-29 ENCOUNTER — Ambulatory Visit: Payer: Self-pay

## 2018-01-29 NOTE — Progress Notes (Signed)
Subjective:    Patient ID: Ashley [name], female    DOB: 03-May-1961, 57 y.o.   MRN: [id number]  HPI  Ashley [name] is a 57 year old female who presents today with chest "heaviness" and left swelling that started one week ago. She also states that she has felt  like she needed to "cough something up." She reports that this has improved. She denies chest pain today. Chest heaviness which she describes as "not pain" occurs intermittently. Radiation of pain:No History of Trauma/lifting:  No Nausea/vomiting: No Diaphoresis: No Shortness of breath: No Pleuritic: No Cough: yes, intermittently Edema: mild swelling in left ankle that has improved  Orthopnea: No PND: No Dizziness:  No Palpitations:  No Syncope:  No Indigestion:  No  Red Flags Worse with exertion:  No Recent Immobility:  No Cancer history:  No Tearing/radiation to back: No  Associated reflex that is noted when eating spicy foods approximately every two weeks. Resolves without treatment.   She started estradiol on 12/25/17 for menopausal symptoms. She reported moderate improvement and decided to increase dose to 2 mg. Two weeks ago she increased her estradiol from 1 mg to 2mg  which have provided excellent benefit for symptoms Former Smoker: Quit 12/2016   She reports noticing that her left ankle was mildly swelling after sitting for extended periods of time with her work. She reports having mild swelling in her right ankle also. She states that this occurs with sitting  that she is doing with her job and has been present for one to two weeks. See ROS above. Treatment: Elevation has provided benefit Diet: She does eat processed foods which are high in sodium. She reports this is due to her low income. She does report eating [place] or [place] food out once a week. She has increased stress as she is changing jobs. Today is her last day at her current job.   Past Medical History:  Diagnosis Date  . Allergy    managed with  saline lavage  . Benign neoplasm of breast 2011  . Bursitis 2011  . History of migraine headaches   . Plantar fasciitis of left foot   . Pleurisy Jan 2013   treated with lorazepam and ibuprofen      Review of Systems    See HPI  Past Medical History:  Diagnosis Date  . Allergy    managed with saline lavage  . Benign neoplasm of breast 2011  . Bursitis 2011  . History of migraine headaches   . Plantar fasciitis of left foot   . Pleurisy Jan 2013   treated with lorazepam and ibuprofen     Social History   Socioeconomic History  . Marital status: Divorced    Spouse name: Not on file  . Number of children: Not on file  . Years of education: Not on file  . Highest education level: Not on file  Social Needs  . Financial resource strain: Not on file  . Food insecurity - worry: Not on file  . Food insecurity - inability: Not on file  . Transportation needs - medical: Not on file  . Transportation needs - non-medical: Not on file  Occupational History  . Not on file  Tobacco Use  . Smoking status: Former Smoker    Last attempt to quit: 09/24/2011    Years since quitting: 6.3  . Smokeless tobacco: Never Used  Substance and Sexual Activity  . Alcohol use: Yes  . Drug use: No  .  Sexual activity: Not on file  Other Topics Concern  . Not on file  Social History Narrative  . Not on file    Past Surgical History:  Procedure Laterality Date  . ABDOMINAL HYSTERECTOMY  2006   for precancerous cervix  . BREAST BIOPSY Bilateral 2010,2011   neg  . ELBOW SURGERY  2005    Family History  Problem Relation Age of Onset  . Brain cancer Mother 30       metastic lung CA (presumed)  . Heart disease Father        complications of paralysis  . Cancer Maternal Grandmother 11       breast cancer  . Breast cancer Maternal Grandmother 80  . Heart disease Sister        dilated cardiomyopathy    Allergies  Allergen Reactions  . Amoxicillin   . Anti-Inflammatory Enzyme  [Nutritional Supplements]   . Bextra [Valdecoxib] Swelling  . Celebrex [Celecoxib] Swelling  . Percocet [Oxycodone-Acetaminophen] Nausea And Vomiting  . Tape Other (See Comments)    blisters  . Vioxx [Rofecoxib] Swelling    Current Outpatient Medications on File Prior to Visit  Medication Sig Dispense Refill  . Ascorbic Acid (VITAMIN C WITH ROSE HIPS) 500 MG tablet Take 500 mg by mouth daily.    Marland Kitchen estradiol (ESTRACE) 2 MG tablet Take 1 tablet (2 mg total) by mouth daily. 30 tablet 3  . fexofenadine (ALLEGRA) 180 MG tablet Take 1 tablet (180 mg total) by mouth daily. 90 tablet 1  . ibuprofen (ADVIL,MOTRIN) 800 MG tablet Take 800 mg by mouth every 8 (eight) hours as needed.    . Naproxen Sodium (ALEVE PO) Take by mouth.    . Phenylephrine HCl (NASAL DECONGESTANT PE PO) Take 1 tablet by mouth daily.    . valACYclovir (VALTREX) 1000 MG tablet Take 1 tablet (1,000 mg total) by mouth 2 (two) times daily. For one week,  Then daily thereafter 31 tablet 5   No current facility-administered medications on file prior to visit.     BP 122/88 (BP Location: Right Arm, Patient Position: Sitting, Cuff Size: Normal)   Pulse 73   Temp 98.5 F (36.9 C) (Oral)   Resp 15   Wt 180 lb 2 oz (81.7 kg)   SpO2 98%   BMI 28.21 kg/m    Objective:   Physical Exam  Constitutional: She is oriented to person, place, and time. She appears well-developed and well-nourished.  Eyes: Pupils are equal, round, and reactive to light. No scleral icterus.  Neck: Neck supple.  Cardiovascular: Normal rate, regular rhythm and intact distal pulses.  Pulses:      Dorsalis pedis pulses are 2+ on the right side, and 2+ on the left side.       Posterior tibial pulses are 2+ on the right side, and 2+ on the left side.  Pulmonary/Chest: Effort normal and breath sounds normal. She has no wheezes. She has no rales.  Abdominal: Soft. Bowel sounds are normal. She exhibits no distension. There is no tenderness.  Musculoskeletal:  Normal range of motion. She exhibits no edema.  No ankle edema present; capillary refill < seconds,   Lymphadenopathy:    She has no cervical adenopathy.  Neurological: She is alert and oriented to person, place, and time. Coordination normal.  Skin: Skin is warm and dry. No rash noted.  Psychiatric: She has a normal mood and affect. Her behavior is normal. Judgment and thought content normal.  Assessment & Plan:  1. Chest heaviness Exam is reassuring; symptom is improved at this visit; no red flags noted. Suspect that this may be related to stress and with VSS, history, and benign exam today make cardiac cause unlikely; she is concerned with her recent increase in estradiol from 1mg  to 2mg  that this may be causing symptom. She reports  improvement with 1 mg and is interested in deceasing dose to 1mg  daily for a trial period.  VSS, Wells criteria (0), and benign history are reassuring and make thromboembolism unlikely.  2. Left ankle swelling No edema present today; swelling with extended periods of time of sitting and eating processed foods are most likely cause. Advised elevation, avoidance of salt, and use of compression stockings. Provided strict return precautions related to warning signs of increasing edema, pain, SOB, or new symptoms. Advised her that she should seek immediate medical attention if any of these present.  She will decrease estradiol for a trial of two weeks and if improvement of symptoms with benefit of menopausal symptoms, will continue this dose.  Finally, reviewed return precautions and advised her to follow up with PCP if symptoms persist.  Delano Metz, FNP-C

## 2018-01-29 NOTE — Telephone Encounter (Signed)
Wrong office

## 2018-01-29 NOTE — Telephone Encounter (Signed)
Patient has appointment with Gregary Signs tomorrow at 0830.

## 2018-01-29 NOTE — Telephone Encounter (Addendum)
Pt. C/o chest "heaviness.It's not really pain." "I feel like I need to cough and clear my throat and chest." Denies shortness of breath, any radiating pain. No nausea or sweating.Concerned it could be related to medication.  Reason for Disposition . [1] Chest pain lasts > 5 minutes AND [2] occurred > 3 days ago (72 hours) AND [3] NO chest pain or cardiac symptoms now  Answer Assessment - Initial Assessment Questions 1. LOCATION: "Where does it hurt?"       Center of chest 2. RADIATION: "Does the pain go anywhere else?" (e.g., into neck, jaw, arms, back)     No 3. ONSET: "When did the chest pain begin?" (Minutes, hours or days)      No pain - heaviness 4. PATTERN "Does the pain come and go, or has it been constant since it started?"  "Does it get worse with exertion?"     Comes and goes 5. DURATION: "How long does it last" (e.g., seconds, minutes, hours)     Unsure 6. SEVERITY: "How bad is the pain?"  (e.g., Scale 1-10; mild, moderate, or severe)    - MILD (1-3): doesn't interfere with normal activities     - MODERATE (4-7): interferes with normal activities or awakens from sleep    - SEVERE (8-10): excruciating pain, unable to do any normal activities       No pain 7. CARDIAC RISK FACTORS: "Do you have any history of heart problems or risk factors for heart disease?" (e.g., prior heart attack, angina; high blood pressure, diabetes, being overweight, high cholesterol, smoking, or strong family history of heart disease)     No 8. PULMONARY RISK FACTORS: "Do you have any history of lung disease?"  (e.g., blood clots in lung, asthma, emphysema, birth control pills)     No 9. CAUSE: "What do you think is causing the chest pain?"     Unsure 10. OTHER SYMPTOMS: "Do you have any other symptoms?" (e.g., dizziness, nausea, vomiting, sweating, fever, difficulty breathing, cough)       Left ankle is swollen 11. PREGNANCY: "Is there any chance you are pregnant?" "When was your last menstrual  period?"       No  Protocols used: CHEST PAIN-A-AH

## 2018-01-30 ENCOUNTER — Ambulatory Visit: Payer: 59 | Admitting: Family Medicine

## 2018-01-30 ENCOUNTER — Encounter: Payer: Self-pay | Admitting: Family Medicine

## 2018-01-30 VITALS — BP 122/88 | HR 73 | Temp 98.5°F | Resp 15 | Wt 180.1 lb

## 2018-01-30 DIAGNOSIS — R0789 Other chest pain: Secondary | ICD-10-CM | POA: Diagnosis not present

## 2018-01-30 DIAGNOSIS — M25472 Effusion, left ankle: Secondary | ICD-10-CM

## 2018-01-30 NOTE — Patient Instructions (Signed)
Go back

## 2018-03-20 ENCOUNTER — Other Ambulatory Visit: Payer: Self-pay | Admitting: Internal Medicine

## 2018-03-29 ENCOUNTER — Ambulatory Visit
Admission: EM | Admit: 2018-03-29 | Discharge: 2018-03-29 | Disposition: A | Discharge status: Home / Self Care | Payer: PRIVATE HEALTH INSURANCE | Attending: Family Medicine | Admitting: Family Medicine

## 2018-03-29 ENCOUNTER — Ambulatory Visit (INDEPENDENT_AMBULATORY_CARE_PROVIDER_SITE_OTHER): Payer: PRIVATE HEALTH INSURANCE

## 2018-03-29 DIAGNOSIS — G8929 Other chronic pain: Secondary | ICD-10-CM | POA: Diagnosis not present

## 2018-03-29 DIAGNOSIS — M25562 Pain in left knee: Secondary | ICD-10-CM | POA: Diagnosis not present

## 2018-03-29 NOTE — ED Triage Notes (Signed)
Pt states since mid march she has been having left leg pain. From the back of her knee radiating down her leg. It feels like it's "pulling" also having swelling and pain. Hurts when she moves it. Not related to an injury. Did take ibuprofen without relief last night.

## 2018-03-29 NOTE — ED Provider Notes (Signed)
[facility]    CSN: [id number] Arrival date & time: 03/29/18  1132     History   Chief Complaint Chief Complaint  Patient presents with  . Leg Pain    HPI Ashley [name] is a 57 y.o. female.   57 yo female with a c/o left knee pain radiating down the leg intermittently for the past 2 months. Denies any fall or other direct traumatic injury. States pain is mostly on the back of the knee and associated with some swelling. Denies any redness, fevers, chills.    Leg Pain    Past Medical History:  Diagnosis Date  . Allergy    managed with saline lavage  . Benign neoplasm of breast 2011  . Bursitis 2011  . History of migraine headaches   . Plantar fasciitis of left foot   . Pleurisy Jan 2013   treated with lorazepam and ibuprofen    Patient Active Problem List   Diagnosis Date Noted  . Menopause syndrome 12/12/2017  . Obesity (BMI 30.0-34.9) 12/12/2017  . Encounter for preventive measure 05/24/2013  . History of tobacco abuse 05/24/2013  . Postmenopausal atrophic vaginitis 01/15/2013  . Bursitis   . Exposure to hepatitis C 03/24/2012  . Allergic rhinitis   . History of migraine headaches     Past Surgical History:  Procedure Laterality Date  . ABDOMINAL HYSTERECTOMY  2006   for precancerous cervix  . BREAST BIOPSY Bilateral 2010,2011   neg  . ELBOW SURGERY  2005    OB History    Gravida  2   Para  2   Term      Preterm      AB      Living  2     SAB      TAB      Ectopic      Multiple      Live Births           Obstetric Comments  First pregnancy 21 First menstrual 14         Home Medications    Prior to Admission medications   Medication Sig Start Date End Date Taking? Authorizing Provider  Ascorbic Acid (VITAMIN C WITH ROSE HIPS) 500 MG tablet Take 500 mg by mouth daily.   Yes [provider]  estradiol (ESTRACE) 2 MG tablet Take 1 tablet (2 mg total) by mouth daily. 01/11/18  Yes Crecencio Mc, MD    fexofenadine (ALLEGRA) 180 MG tablet Take 1 tablet (180 mg total) by mouth daily. 05/22/13  Yes Crecencio Mc, MD  ibuprofen (ADVIL,MOTRIN) 800 MG tablet Take 800 mg by mouth every 8 (eight) hours as needed.   Yes [provider]  Naproxen Sodium (ALEVE PO) Take by mouth.   Yes [provider]  Phenylephrine HCl (NASAL DECONGESTANT PE PO) Take 1 tablet by mouth daily.   Yes [provider]  valACYclovir (VALTREX) 1000 MG tablet Take 1 tablet (1,000 mg total) by mouth 2 (two) times daily. For one week,  Then daily thereafter 12/11/17  Yes Crecencio Mc, MD    Family History Family History  Problem Relation Age of Onset  . Brain cancer Mother 25       metastic lung CA (presumed)  . Heart disease Father        complications of paralysis  . Cancer Maternal Grandmother 14       breast cancer  . Breast cancer Maternal Grandmother 80  . Heart  disease Sister        dilated cardiomyopathy    Social History Social History   Tobacco Use  . Smoking status: Former Smoker    Last attempt to quit: 09/24/2011    Years since quitting: 6.5  . Smokeless tobacco: Never Used  Substance Use Topics  . Alcohol use: Yes  . Drug use: No     Allergies   Amoxicillin; Anti-inflammatory enzyme [nutritional supplements]; Bextra [valdecoxib]; Celebrex [celecoxib]; Percocet [oxycodone-acetaminophen]; Tape; and Vioxx [rofecoxib]   Review of Systems Review of Systems   Physical Exam Triage Vital Signs ED Triage Vitals  Enc Vitals Group     BP 03/29/18 1206 127/83     Pulse Rate 03/29/18 1206 77     Resp 03/29/18 1206 18     Temp 03/29/18 1206 98.6 F (37 C)     Temp Source 03/29/18 1206 Oral     SpO2 03/29/18 1206 97 %     Weight --      Height --      Head Circumference --      Peak Flow --      Pain Score 03/29/18 1208 5     Pain Loc --      Pain Edu? --      Excl. in [place]? --    No data found.  Updated Vital Signs BP 127/83 (BP Location: Right Arm)    Pulse 77   Temp 98.6 F (37 C) (Oral)   Resp 18   SpO2 97%   Visual Acuity Right Eye Distance:   Left Eye Distance:   Bilateral Distance:    Right Eye Near:   Left Eye Near:    Bilateral Near:     Physical Exam  Constitutional: She appears well-developed and well-nourished. No distress.  Musculoskeletal:       Left knee: She exhibits normal range of motion, no swelling, no effusion, no ecchymosis, no deformity, no laceration, no erythema, normal alignment, no LCL laxity, normal patellar mobility, normal meniscus and no MCL laxity. Tenderness (posterior and lateral) found.  Skin: She is not diaphoretic.  Nursing note and vitals reviewed.    UC Treatments / Results  Labs (all labs ordered are listed, but only abnormal results are displayed) Labs Reviewed - No data to display  EKG None  Radiology Dg Knee Complete 4 Views Left  Result Date: 03/29/2018 CLINICAL DATA:  Tightness in the knee posteriorly. Pain shooting down the lower extremity since last night. EXAM: LEFT KNEE - COMPLETE 4+ VIEW COMPARISON:  None. FINDINGS: No evidence of fracture, dislocation, or joint effusion. No evidence of arthropathy or other focal bone abnormality. Soft tissues are unremarkable. IMPRESSION: Negative left knee radiographs. Electronically Signed   By: San Morelle M.D.   On: 03/29/2018 13:47    Procedures Procedures (including critical care time)  Medications Ordered in UC Medications - No data to display  Initial Impression / Assessment and Plan / UC Course  I have reviewed the triage vital signs and the nursing notes.  Pertinent labs & imaging results that were available during my care of the patient were reviewed by me and considered in my medical decision making (see chart for details).      Final Clinical Impressions(s) / UC Diagnoses   Final diagnoses:  Chronic pain of left knee    ED Prescriptions    None     1. x-ray results and diagnosis reviewed with  patient 2. Recommend supportive treatment with rest, ice, elevation,  otc analgesics 3. Follow up with PCP for possible further referral for imaging and/or specialist for further evaluation  4. Follow-up prn if symptoms worsen or don't improve   Controlled Substance Prescriptions [place] Controlled Substance Registry consulted? Not Applicable   Norval Gable, MD 03/29/18 1504

## 2018-04-23 ENCOUNTER — Other Ambulatory Visit: Payer: Self-pay | Admitting: Internal Medicine

## 2018-05-01 ENCOUNTER — Ambulatory Visit: Payer: PRIVATE HEALTH INSURANCE | Admitting: Family Medicine

## 2018-05-01 ENCOUNTER — Encounter: Payer: Self-pay | Admitting: Family Medicine

## 2018-05-01 VITALS — BP 104/70 | HR 81 | Temp 98.5°F | Ht 67.0 in | Wt 179.8 lb

## 2018-05-01 DIAGNOSIS — M25562 Pain in left knee: Secondary | ICD-10-CM | POA: Diagnosis not present

## 2018-05-01 DIAGNOSIS — M2392 Unspecified internal derangement of left knee: Secondary | ICD-10-CM

## 2018-05-01 NOTE — Progress Notes (Signed)
Dr. Frederico Hamman T. Kallyn Demarcus, MD, [facility] [place], [address] Phone: [phone] Fax: [phone]  05/01/2018  Patient: Ashley [name], MRN: [id number], DOB: July 15, 1961, 57 y.o.  Primary Physician:  Crecencio Mc, MD   Chief Complaint  Patient presents with  . Knee Pain    left-Seen at [facility] on 03/29/18  . Leg Pain    left   Subjective:   Ashley [name] is a 57 y.o. very pleasant female patient who presents with the following:  L knee pain: lateral and posterior knee pain. Was sitting on the floor and got up off the floor - felt some stiffness without acute pain prior to her knee hurting. This all began about 2-3 months ago. No swelling. Went to UC 1 mo ago. Has tried some ibuprofen, soaking, ice. This has helped a little bit, but she is still symptomatic.   Getting up from a sitting position causes pain.  Rotational movements with the knee also cause some significant pain including getting out of a car as well as sometimes getting out of bed.  She has not had any mechanical symptoms.  No locking up of the joint, no symptomatic giving way.  She has not had any significant prior surgical history of the affected knee, no trauma, fractures in the past.  Past Medical History, Surgical History, Social History, Family History, Problem List, Medications, and Allergies have been reviewed and updated if relevant.  Patient Active Problem List   Diagnosis Date Noted  . Menopause syndrome 12/12/2017  . Obesity (BMI 30.0-34.9) 12/12/2017  . Encounter for preventive measure 05/24/2013  . History of tobacco abuse 05/24/2013  . Postmenopausal atrophic vaginitis 01/15/2013  . Bursitis   . Exposure to hepatitis C 03/24/2012  . Allergic rhinitis   . History of migraine headaches     Past Medical History:  Diagnosis Date  . Allergy    managed with saline lavage  . Benign neoplasm of breast 2011  . Bursitis 2011  . History of migraine  headaches   . Plantar fasciitis of left foot   . Pleurisy Jan 2013   treated with lorazepam and ibuprofen    Past Surgical History:  Procedure Laterality Date  . ABDOMINAL HYSTERECTOMY  2006   for precancerous cervix  . BREAST BIOPSY Bilateral 2010,2011   neg  . ELBOW SURGERY  2005    Social History   Socioeconomic History  . Marital status: Divorced    Spouse name: Not on file  . Number of children: Not on file  . Years of education: Not on file  . Highest education level: Not on file  Occupational History  . Not on file  Social Needs  . Financial resource strain: Not on file  . Food insecurity:    Worry: Not on file    Inability: Not on file  . Transportation needs:    Medical: Not on file    Non-medical: Not on file  Tobacco Use  . Smoking status: Former Smoker    Last attempt to quit: 09/24/2011    Years since quitting: 6.6  . Smokeless tobacco: Never Used  Substance and Sexual Activity  . Alcohol use: Yes  . Drug use: No  . Sexual activity: Not on file  Lifestyle  . Physical activity:    Days per week: Not on file    Minutes per session: Not on file  . Stress: Not on file  Relationships  .  Social connections:    Talks on phone: Not on file    Gets together: Not on file    Attends religious service: Not on file    Active member of club or organization: Not on file    Attends meetings of clubs or organizations: Not on file    Relationship status: Not on file  . Intimate partner violence:    Fear of current or ex partner: Not on file    Emotionally abused: Not on file    Physically abused: Not on file    Forced sexual activity: Not on file  Other Topics Concern  . Not on file  Social History Narrative  . Not on file    Family History  Problem Relation Age of Onset  . Brain cancer Mother 34       metastic lung CA (presumed)  . Heart disease Father        complications of paralysis  . Cancer Maternal Grandmother 36       breast cancer  .  Breast cancer Maternal Grandmother 80  . Heart disease Sister        dilated cardiomyopathy    Allergies  Allergen Reactions  . Amoxicillin   . Anti-Inflammatory Enzyme [Nutritional Supplements]   . Bextra [Valdecoxib] Swelling  . Celebrex [Celecoxib] Swelling  . Percocet [Oxycodone-Acetaminophen] Nausea And Vomiting  . Tape Other (See Comments)    blisters  . Vioxx [Rofecoxib] Swelling    Medication list reviewed and updated in full in [place].  GEN: No fevers, chills. Nontoxic. Primarily MSK c/o today. MSK: Detailed in the HPI GI: tolerating PO intake without difficulty Neuro: No numbness, parasthesias, or tingling associated. Otherwise the pertinent positives of the ROS are noted above.   Objective:   BP 104/70   Pulse 81   Temp 98.5 F (36.9 C) (Oral)   Ht 5\' 7"  (1.702 m)   Wt 179 lb 12 oz (81.5 kg)   BMI 28.15 kg/m    GEN: WDWN, NAD, Non-toxic, Alert & Oriented x 3 HEENT: Atraumatic, Normocephalic.  Ears and Nose: No external deformity. EXTR: No clubbing/cyanosis/edema NEURO: Normal gait.  PSYCH: Normally interactive. Conversant. Not depressed or anxious appearing.  Calm demeanor.    Left knee: Full extension.  Flexion to 112 degrees.  No significant effusion.  There is significant posterior medial joint line tenderness pain.  This is the point of maximal tenderness.  More mild medial joint line tenderness elsewhere.  No pain with manipulation of the patella facets.  Stable MCL and LCL.  ACL and PCL appear intact.  McMurray's causes pain but no mechanical symptoms.  Flexion pinch is positive.  Bounce home test is positive.  Radiology: Dg Knee Complete 4 Views Left  Result Date: 03/29/2018 CLINICAL DATA:  Tightness in the knee posteriorly. Pain shooting down the lower extremity since last night. EXAM: LEFT KNEE - COMPLETE 4+ VIEW COMPARISON:  None. FINDINGS: No evidence of fracture, dislocation, or joint effusion. No evidence of arthropathy or other  focal bone abnormality. Soft tissues are unremarkable. IMPRESSION: Negative left knee radiographs. Electronically Signed   By: San Morelle M.D.   On: 03/29/2018 13:47   Assessment and Plan:   Acute internal derangement of left knee  Acute pain of left knee  Suspect degenerative medial meniscal tear given mechanism and exam.  The majority of these do well with conservative care.  She has access to a exercise bike, which is ideal.  Also gave her a medial meniscal  tear rehabilitation guide for daily use.  If she is having trouble with this, I am very happy to refer her to formal physical therapy.  Offered to inject her knee with corticosteroid, for some symptomatic relief, but the patient was reluctant and wanted to proceed as conservatively as possible.  I think that this is reasonable.  She will f/u with me in 6-8 weeks. If asymptomatic, I think she can cancel this appt.   I appreciate the opportunity to evaluate this very friendly patient. If you have any question regarding her care or prognosis, do not hesitate to ask.   Signed,  Maud Deed. Ethelmae Ringel, MD   Allergies as of 05/01/2018      Reactions   Amoxicillin    Anti-inflammatory Enzyme [nutritional Supplements]    Bextra [valdecoxib] Swelling   Celebrex [celecoxib] Swelling   Percocet [oxycodone-acetaminophen] Nausea And Vomiting   Tape Other (See Comments)   blisters   Vioxx [rofecoxib] Swelling      Medication List        Accurate as of 05/01/18 11:59 PM. Always use your most recent med list.          ALEVE PO Take by mouth.   estradiol 2 MG tablet Commonly known as:  ESTRACE Take 1 tablet (2 mg total) by mouth daily.   estradiol 1 MG tablet Commonly known as:  ESTRACE TAKE (1) TABLET BY MOUTH EVERY DAY   fexofenadine 180 MG tablet Commonly known as:  ALLEGRA Take 1 tablet (180 mg total) by mouth daily.   ibuprofen 800 MG tablet Commonly known as:  ADVIL,MOTRIN Take 800 mg by mouth every 8  (eight) hours as needed.   NASAL DECONGESTANT PE PO Take 1 tablet by mouth daily.   valACYclovir 1000 MG tablet Commonly known as:  VALTREX Take 1 tablet (1,000 mg total) by mouth 2 (two) times daily. For one week,  Then daily thereafter   vitamin C with rose hips 500 MG tablet Take 500 mg by mouth daily.

## 2018-06-15 ENCOUNTER — Encounter: Payer: Self-pay | Admitting: Internal Medicine

## 2018-06-20 ENCOUNTER — Telehealth: Payer: Self-pay

## 2018-06-20 NOTE — Telephone Encounter (Signed)
Spoke with pt and she stated that she thinks she is having side effects to the estradiol she is taking. Pt stated that she has started having migraines daily. Pt stated that she would like an appt to come in and discuss this with Dr. Derrel Nip but she can only come on Wednesdays. Pt has been scheduled for next Wednesday at 4pm.

## 2018-06-20 NOTE — Telephone Encounter (Signed)
Advising patient to suspend the estradiol until her appointment. MyChart message sent

## 2018-06-20 NOTE — Telephone Encounter (Signed)
Copied from [place] [id number]. Topic: General - Other >> Jun 19, 2018  4:38 PM Ashley [name] wrote: Pt needing to come in to see Dr. Derrel Nip on any Wednesday 3:30 pm or after.  There is nothing available until September.  Pt is needing a lot sooner.

## 2018-06-20 NOTE — Telephone Encounter (Signed)
Spoke with pt and she was advised to stop taking the estradiol until her appointment with Dr. Derrel Nip next week. Pt gave a verbal understanding.

## 2018-06-25 ENCOUNTER — Encounter: Payer: Self-pay | Admitting: Internal Medicine

## 2018-06-25 ENCOUNTER — Ambulatory Visit: Payer: PRIVATE HEALTH INSURANCE | Admitting: Internal Medicine

## 2018-06-25 DIAGNOSIS — G4452 New daily persistent headache (NDPH): Secondary | ICD-10-CM

## 2018-06-25 DIAGNOSIS — J0110 Acute frontal sinusitis, unspecified: Secondary | ICD-10-CM | POA: Diagnosis not present

## 2018-06-25 MED ORDER — LEVOFLOXACIN 500 MG PO TABS: 500.0000 mg | ORAL_TABLET | Freq: Every day | ORAL | 0 refills | Status: DC

## 2018-06-25 MED ORDER — MOMETASONE FUROATE 50 MCG/ACT NA SUSP: 2.0000 | Freq: Every day | NASAL | 12 refills | Status: DC

## 2018-06-25 NOTE — Patient Instructions (Signed)
I am treating you for sinusitis with Levaquin (an antibiotic) , Nasonex and phenylephrine   Stay off the estradiol .  If headaches continue to be daily after 3 more weeks let me know.   We will the try adding a daily preventive medication

## 2018-06-25 NOTE — Progress Notes (Signed)
Subjective:  Patient ID: Ashley [name], female    DOB: 11-21-1961  Age: 57 y.o. MRN: [id number]  CC: Diagnoses of Acute non-recurrent frontal sinusitis and NPDH (new persistent daily headache) were pertinent to this visit.  HPI Ashley [name] presents for follow up on increased migraine frequency.  Patient states that she has been having a daily headache past  3 weeks.  No temporal relationship to food or medications  But feels it may be related to recent initiation of estradiol for management of menopause syndrome in mid February.  The headaches are occurring in the early morning,  Some during work,  Some on the ride home , sometimes more than once daily.  Some are triggered by lights flashing .  Some are occipital and more suggestive of tension headaches, but feels that she has a true migraine headache every other day .  History of migraine disorder since her 20's,  Menstrual and stress related remenstrual in her late 20's.  She is concerned about the cause because her mother died of a brain tumor of unclear etiology (presumed to be  lung cancer that had metastasized to her brain .)   She reports persistent Sinus congestion for over one week and new onset right ear pain,  Headaches  Her blood pressure has been elevated at work 133 to 144/84 to 86 .  Outpatient Medications Prior to Visit  Medication Sig Dispense Refill  . fexofenadine (ALLEGRA) 180 MG tablet Take 1 tablet (180 mg total) by mouth daily. 90 tablet 1  . ibuprofen (ADVIL,MOTRIN) 800 MG tablet Take 800 mg by mouth every 8 (eight) hours as needed.    . Naproxen Sodium (ALEVE PO) Take by mouth.    . Phenylephrine HCl (NASAL DECONGESTANT PE PO) Take 1 tablet by mouth daily.    . valACYclovir (VALTREX) 1000 MG tablet Take 1 tablet (1,000 mg total) by mouth 2 (two) times daily. For one week,  Then daily thereafter 31 tablet 5  . vitamin C (ASCORBIC ACID) 500 MG tablet Take 500 mg by mouth daily.    Marland Kitchen estradiol (ESTRACE) 1 MG tablet TAKE  (1) TABLET BY MOUTH EVERY DAY (Patient not taking: Reported on 06/25/2018) 30 tablet 2  . estradiol (ESTRACE) 2 MG tablet Take 1 tablet (2 mg total) by mouth daily. (Patient not taking: Reported on 06/25/2018) 30 tablet 3  . Ascorbic Acid (VITAMIN C WITH ROSE HIPS) 500 MG tablet Take 500 mg by mouth daily.     No facility-administered medications prior to visit.     Review of Systems;  Patient denies headache, fevers, malaise, unintentional weight loss, skin rash, eye pain, sinus congestion and sinus pain, sore throat, dysphagia,  hemoptysis , cough, dyspnea, wheezing, chest pain, palpitations, orthopnea, edema, abdominal pain, nausea, melena, diarrhea, constipation, flank pain, dysuria, hematuria, urinary  Frequency, nocturia, numbness, tingling, seizures,  Focal weakness, Loss of consciousness,  Tremor, insomnia, depression, anxiety, and suicidal ideation.      Objective:  BP 122/78 (BP Location: Left Arm, Patient Position: Sitting, Cuff Size: Normal)   Pulse 89   Temp 98.4 F (36.9 C) (Oral)   Resp 16   Ht 5\' 7"  (1.702 m)   Wt 178 lb 12.8 oz (81.1 kg)   SpO2 95%   BMI 28.00 kg/m   BP Readings from Last 3 Encounters:  06/25/18 122/78  05/01/18 104/70  03/29/18 127/83    Wt Readings from Last 3 Encounters:  06/25/18 178 lb 12.8 oz (81.1 kg)  05/01/18  179 lb 12 oz (81.5 kg)  01/30/18 180 lb 2 oz (81.7 kg)    General appearance: alert, cooperative and appears stated age FAce:  Right frontal sinus tenderness  Ears: right TM dull and erythematous,  left TM normal   Throat: lips, mucosa, and tongue normal; teeth and gums normal Neck: no adenopathy, no carotid bruit, supple, symmetrical, trachea midline and thyroid not enlarged, symmetric, no tenderness/mass/nodules Back: symmetric, no curvature. ROM normal. No CVA tenderness. Lungs: clear to auscultation bilaterally Heart: regular rate and rhythm, S1, S2 normal, no murmur, click, rub or gallop Abdomen: soft, non-tender; bowel  sounds normal; no masses,  no organomegaly Pulses: 2+ and symmetric Skin: Skin color, texture, turgor normal. No rashes or lesions Lymph nodes: Cervical, supraclavicular, and axillary nodes normal. Neuro: CNs 2-12 intact. DTRs 2+/4 in biceps, brachioradialis, patellars and achilles. Muscle strength 5/5 in upper and lower exremities. Fine resting tremor bilaterally both hands cerebellar function normal. Romberg negative.  No pronator drift.   Gait normal.   No results found for: HGBA1C  Lab Results  Component Value Date   CREATININE 0.96 12/13/2017   CREATININE 1.08 04/25/2016   CREATININE 0.9 05/15/2013    Lab Results  Component Value Date   WBC 4.8 12/13/2017   HGB 12.6 12/13/2017   HCT 38.0 12/13/2017   PLT 264 12/13/2017   GLUCOSE 82 12/13/2017   CHOL 263 (H) 12/13/2017   TRIG 98 12/13/2017   HDL 56 12/13/2017   LDLDIRECT 145.0 04/25/2016   LDLCALC 187 (H) 12/13/2017   ALT 30 12/13/2017   AST 32 12/13/2017   NA 143 12/13/2017   K 4.9 12/13/2017   CL 105 12/13/2017   CREATININE 0.96 12/13/2017   BUN 16 12/13/2017   CO2 23 12/13/2017   TSH 1.750 12/13/2017    Dg Knee Complete 4 Views Left  Result Date: 03/29/2018 CLINICAL DATA:  Tightness in the knee posteriorly. Pain shooting down the lower extremity since last night. EXAM: LEFT KNEE - COMPLETE 4+ VIEW COMPARISON:  None. FINDINGS: No evidence of fracture, dislocation, or joint effusion. No evidence of arthropathy or other focal bone abnormality. Soft tissues are unremarkable. IMPRESSION: Negative left knee radiographs. Electronically Signed   By: San Morelle M.D.   On: 03/29/2018 13:47    Assessment & Plan:   Problem List Items Addressed This Visit    Sinusitis, acute frontal    Given chronicity of symptoms, development of facial pain and exam consistent with bacterial URI,  Will treat with empiric antibiotics, (she has deferred a prednisone taper), decongestants, and saline lavage.        Relevant  Medications   levofloxacin (LEVAQUIN) 500 MG tablet   mometasone (NASONEX) 50 MCG/ACT nasal spray   NPDH (new persistent daily headache)    Etiology unclear.  Treating sinusitis,  Stopped estradiol.  Not using NSAID daily.   If symptoms persist, will start daily beta blocker.        A total of 25 minutes of face to face time was spent with patient more than half of which was spent in counselling about the above mentioned conditions  and coordination of care   I have discontinued Karen Chafe. Bernabe "Janine"'s vitamin C with rose hips. I am also having her start on levofloxacin and mometasone. Additionally, I am having her maintain her ibuprofen, Naproxen Sodium (ALEVE PO), Phenylephrine HCl (NASAL DECONGESTANT PE PO), fexofenadine, valACYclovir, estradiol, estradiol, and vitamin C.  Meds ordered this encounter  Medications  . levofloxacin (LEVAQUIN) 500  MG tablet    Sig: Take 1 tablet (500 mg total) by mouth daily.    Dispense:  7 tablet    Refill:  0  . mometasone (NASONEX) 50 MCG/ACT nasal spray    Sig: Place 2 sprays into the nose daily.    Dispense:  17 g    Refill:  12    Medications Discontinued During This Encounter  Medication Reason  . Ascorbic Acid (VITAMIN C WITH ROSE HIPS) 500 MG tablet Change in therapy    Follow-up: No follow-ups on file.   Crecencio Mc, MD

## 2018-06-27 DIAGNOSIS — J019 Acute sinusitis, unspecified: Secondary | ICD-10-CM | POA: Insufficient documentation

## 2018-06-27 DIAGNOSIS — J329 Chronic sinusitis, unspecified: Secondary | ICD-10-CM | POA: Insufficient documentation

## 2018-06-27 DIAGNOSIS — G4452 New daily persistent headache (NDPH): Secondary | ICD-10-CM | POA: Insufficient documentation

## 2018-06-27 NOTE — Assessment & Plan Note (Signed)
Given chronicity of symptoms, development of facial pain and exam consistent with bacterial URI,  Will treat with empiric antibiotics, (she has deferred a prednisone taper), decongestants, and saline lavage.

## 2018-06-27 NOTE — Assessment & Plan Note (Addendum)
Etiology unclear.  Treating sinusitis,  Stopped estradiol.  Not using NSAID daily.   If symptoms persist, will start daily beta blocker.

## 2018-07-14 MED ORDER — ESCITALOPRAM OXALATE 5 MG PO TABS: 5.0000 mg | ORAL_TABLET | Freq: Every day | ORAL | 2 refills | Status: DC

## 2018-07-15 ENCOUNTER — Other Ambulatory Visit: Payer: Self-pay | Admitting: Internal Medicine

## 2018-08-12 ENCOUNTER — Other Ambulatory Visit: Payer: Self-pay | Admitting: Internal Medicine

## 2018-08-12 MED ORDER — ESCITALOPRAM OXALATE 10 MG PO TABS: 10.0000 mg | ORAL_TABLET | Freq: Every day | ORAL | 2 refills | Status: DC

## 2018-10-28 ENCOUNTER — Encounter: Payer: Self-pay | Admitting: Family Medicine

## 2018-10-28 ENCOUNTER — Ambulatory Visit (INDEPENDENT_AMBULATORY_CARE_PROVIDER_SITE_OTHER): Payer: PRIVATE HEALTH INSURANCE

## 2018-10-28 ENCOUNTER — Ambulatory Visit: Payer: PRIVATE HEALTH INSURANCE | Admitting: Family Medicine

## 2018-10-28 VITALS — BP 138/80 | HR 102 | Temp 98.3°F | Ht 67.0 in | Wt 180.0 lb

## 2018-10-28 DIAGNOSIS — R062 Wheezing: Secondary | ICD-10-CM

## 2018-10-28 DIAGNOSIS — R0989 Other specified symptoms and signs involving the circulatory and respiratory systems: Secondary | ICD-10-CM | POA: Diagnosis not present

## 2018-10-28 DIAGNOSIS — J988 Other specified respiratory disorders: Secondary | ICD-10-CM

## 2018-10-28 DIAGNOSIS — R058 Other specified cough: Secondary | ICD-10-CM

## 2018-10-28 DIAGNOSIS — R05 Cough: Secondary | ICD-10-CM

## 2018-10-28 MED ORDER — HYDROCOD POLST-CPM POLST ER 10-8 MG/5ML PO SUER: 5.0000 mL | Freq: Two times a day (BID) | ORAL | 0 refills | Status: DC | PRN

## 2018-10-28 MED ORDER — DOXYCYCLINE HYCLATE 100 MG PO TABS: 100.0000 mg | ORAL_TABLET | Freq: Two times a day (BID) | ORAL | 0 refills | Status: DC

## 2018-10-28 MED ORDER — ALBUTEROL SULFATE HFA 108 (90 BASE) MCG/ACT IN AERS: 2.0000 | INHALATION_SPRAY | Freq: Four times a day (QID) | RESPIRATORY_TRACT | 2 refills | Status: DC | PRN

## 2018-10-28 NOTE — Progress Notes (Signed)
Subjective:    Patient ID: Ashley [name], female    DOB: 07-Mar-1961, 57 y.o.   MRN: [id number]  HPI  Presents to clinic c/o cough and congestion for about 10 days.  Patient states she has been taking her allergy medication every day, also been using Robitussin-DM and doing nasal rinses without much help and symptom relief.  Patient states the congestion seems to be in her chest and she cannot cough it out.  Patient does report cough is worse at night.  Denies fever or chills.  Denies nausea, vomiting or diarrhea.  Denies chest pain.    Patient states when she does cough up some phlegm it is thick green in color.  Patient Active Problem List   Diagnosis Date Noted  . Sinusitis, acute frontal 06/27/2018  . NPDH (new persistent daily headache) 06/27/2018  . Menopause syndrome 12/12/2017  . Obesity (BMI 30.0-34.9) 12/12/2017  . Encounter for preventive measure 05/24/2013  . History of tobacco abuse 05/24/2013  . Postmenopausal atrophic vaginitis 01/15/2013  . Bursitis   . Exposure to hepatitis C 03/24/2012  . Allergic rhinitis   . History of migraine headaches    Social History   Tobacco Use  . Smoking status: Former Smoker    Last attempt to quit: 09/24/2011    Years since quitting: 7.0  . Smokeless tobacco: Never Used  Substance Use Topics  . Alcohol use: Yes   Review of Systems  Constitutional: Negative for chills, fatigue and fever.  HENT: +congestion. no ear pain, sinus pain and sore throat.   Eyes: Negative.   Respiratory: +cough & wheezing.  Cardiovascular: Negative for chest pain, palpitations and leg swelling.  Gastrointestinal: Negative for abdominal pain, diarrhea, nausea and vomiting.  Genitourinary: Negative for dysuria, frequency and urgency.  Musculoskeletal: Negative for arthralgias and myalgias.  Skin: Negative for color change, pallor and rash.  Neurological: Negative for syncope, light-headedness and headaches.  Psychiatric/Behavioral: The patient is not  nervous/anxious.       Objective:   Physical Exam  Constitutional: She is oriented to person, place, and time. No distress.  HENT:  Head: Normocephalic and atraumatic.  +nasal congestion and post nasal drip  Eyes: Conjunctivae and EOM are normal. No scleral icterus.  Neck: Neck supple. No tracheal deviation present.  Cardiovascular: Normal rate and regular rhythm.  Pulmonary/Chest: Effort normal. No respiratory distress. She has wheezes (with scattered rhonchi).  Musculoskeletal: She exhibits no edema.  Gait normal  Neurological: She is alert and oriented to person, place, and time.  Skin: Skin is warm and dry. No pallor.  Psychiatric: She has a normal mood and affect. Her behavior is normal.  Nursing note and vitals reviewed.     Vitals:   10/28/18 1519  BP: 138/80  Pulse: (!) 102  Temp: 98.3 F (36.8 C)  SpO2: 93%   Assessment & Plan:   Productive cough, rhonchi, wheezes, respiratory infection-we will get chest x-ray in clinic due to lung sounds in description of patient's thick discolored phlegm.  Patient will use albuterol inhaler to help open up the lungs, she also will take doxycycline twice daily for 7 days due to suspected respiratory infection.  Offered to do steroid burst, but patient states she does not do well when taking steroids and does not like how they make her feel.  Patient will use Tussionex cough syrup to help her suppress cough and be able to get some sleep at night, she does know this medication does make her drowsy.  Advised patient she can try over-the-counter Delsym during the day to help calm her cough.  [place] PMP registry checked and is ok for Rx of tussionex syrup  Patient will keep regularly scheduled follow-up as planned.  Advised to return to clinic sooner if any new issues arise or current symptoms persist or worsen/do not improve with treatment plan.

## 2018-10-29 ENCOUNTER — Encounter: Payer: Self-pay | Admitting: Family Medicine

## 2018-10-29 ENCOUNTER — Telehealth: Payer: Self-pay | Admitting: Internal Medicine

## 2018-10-29 NOTE — Telephone Encounter (Signed)
Copied from [place] [id number]. Topic: Quick Communication - See Telephone Encounter >> Oct 29, 2018  4:07 PM Bea Graff, NT wrote: CRM for notification. See Telephone encounter for: 10/29/18. Pt requesting a callback with her chest xray results.

## 2018-10-29 NOTE — Telephone Encounter (Signed)
Please advise 

## 2018-10-30 NOTE — Telephone Encounter (Signed)
Spoke with pt and she stated that she received the mychart message with the results.

## 2018-11-17 ENCOUNTER — Other Ambulatory Visit: Payer: Self-pay | Admitting: Internal Medicine

## 2018-12-15 ENCOUNTER — Telehealth: Payer: Self-pay

## 2018-12-15 NOTE — Telephone Encounter (Signed)
error 

## 2018-12-17 ENCOUNTER — Ambulatory Visit (INDEPENDENT_AMBULATORY_CARE_PROVIDER_SITE_OTHER): Payer: PRIVATE HEALTH INSURANCE | Admitting: Internal Medicine

## 2018-12-17 ENCOUNTER — Encounter: Payer: Self-pay | Admitting: Internal Medicine

## 2018-12-17 VITALS — BP 138/82 | HR 80 | Temp 98.4°F | Resp 15 | Ht 67.0 in | Wt 177.2 lb

## 2018-12-17 DIAGNOSIS — J0100 Acute maxillary sinusitis, unspecified: Secondary | ICD-10-CM | POA: Diagnosis not present

## 2018-12-17 DIAGNOSIS — E782 Mixed hyperlipidemia: Secondary | ICD-10-CM

## 2018-12-17 DIAGNOSIS — Z299 Encounter for prophylactic measures, unspecified: Secondary | ICD-10-CM

## 2018-12-17 DIAGNOSIS — R748 Abnormal levels of other serum enzymes: Secondary | ICD-10-CM

## 2018-12-17 DIAGNOSIS — R058 Other specified cough: Secondary | ICD-10-CM

## 2018-12-17 DIAGNOSIS — R05 Cough: Secondary | ICD-10-CM

## 2018-12-17 DIAGNOSIS — Z1239 Encounter for other screening for malignant neoplasm of breast: Secondary | ICD-10-CM

## 2018-12-17 LAB — COMPREHENSIVE METABOLIC PANEL
ALBUMIN: 4.6 g/dL (ref 3.5–5.2)
ALT: 23 U/L (ref 0–35)
AST: 29 U/L (ref 0–37)
Alkaline Phosphatase: 133 U/L — ABNORMAL HIGH (ref 39–117)
BUN: 11 mg/dL (ref 6–23)
CO2: 29 mEq/L (ref 19–32)
Calcium: 9.8 mg/dL (ref 8.4–10.5)
Chloride: 101 mEq/L (ref 96–112)
Creatinine, Ser: 1.02 mg/dL (ref 0.40–1.20)
GFR: 55.9 mL/min — ABNORMAL LOW (ref >60.00)
Glucose, Bld: 86 mg/dL (ref 70–99)
Potassium: 4.2 mEq/L (ref 3.5–5.1)
Sodium: 138 mEq/L (ref 135–145)
Total Bilirubin: 0.4 mg/dL (ref 0.2–1.2)
Total Protein: 7.4 g/dL (ref 6.0–8.3)

## 2018-12-17 LAB — LIPID PANEL
Cholesterol: 285 mg/dL — ABNORMAL HIGH (ref 0–200)
HDL: 47.8 mg/dL (ref >39.00)
NonHDL: 237.26
TRIGLYCERIDES: 201 mg/dL — AB (ref 0.0–149.0)
Total CHOL/HDL Ratio: 6
VLDL: 40.2 mg/dL — ABNORMAL HIGH (ref 0.0–40.0)

## 2018-12-17 LAB — LDL CHOLESTEROL, DIRECT: LDL DIRECT: 193 mg/dL

## 2018-12-17 LAB — TSH: TSH: 1.55 u[IU]/mL (ref 0.35–4.50)

## 2018-12-17 MED ORDER — HYDROCOD POLST-CPM POLST ER 10-8 MG/5ML PO SUER: 5.0000 mL | Freq: Two times a day (BID) | ORAL | 0 refills | Status: DC | PRN

## 2018-12-17 MED ORDER — CEFDINIR 300 MG PO CAPS: 300.0000 mg | ORAL_CAPSULE | Freq: Two times a day (BID) | ORAL | 0 refills | Status: DC

## 2018-12-17 MED ORDER — FLUCONAZOLE 150 MG PO TABS: 150.0000 mg | ORAL_TABLET | Freq: Every day | ORAL | 0 refills | Status: DC

## 2018-12-17 NOTE — Progress Notes (Signed)
Patient ID: Ashley [name], female    DOB: 02-Mar-1961  Age: 58 y.o. MRN: [id number]  The patient is here for annual preventive examination and management of other chronic and acute problems.   The risk factors are reflected in the social history.  The roster of all physicians providing medical care to patient - is listed in the Snapshot section of the chart.  Activities of daily living:  The patient is 100% independent in all ADLs: dressing, toileting, feeding as well as independent mobility  Home safety : The patient has smoke detectors in the home. They wear seatbelts.  There are no firearms at home. There is no violence in the home.   There is no risks for hepatitis, STDs or HIV. There is no   history of blood transfusion. They have no travel history to infectious disease endemic areas of the world.  The patient has seen their dentist in the last six month. They have seen their eye doctor in the last year. They admit to slight hearing difficulty with regard to whispered voices and some television programs.  They have deferred audiologic testing in the last year.  They do not  have excessive sun exposure. Discussed the need for sun protection: hats, long sleeves and use of sunscreen if there is significant sun exposure.   Diet: the importance of a healthy diet is discussed. They do have a healthy diet.  The benefits of regular aerobic exercise were discussed. She walks 4 times per week ,  20 minutes.   Depression screen: there are no signs or vegative symptoms of depression- irritability, change in appetite, anhedonia, sadness/tearfullness.  Cognitive assessment: the patient manages all their financial and personal affairs and is actively engaged. They could relate day,date,year and events; recalled 2/3 objects at 3 minutes; performed clock-face test normally.  The following portions of the patient's history were reviewed and updated as appropriate: allergies, current medications, past family  history, past medical history,  past surgical history, past social history  and problem list.  Visual acuity was not assessed per patient preference since she has regular follow up with her ophthalmologist. Hearing and body mass index were assessed and reviewed.   During the course of the visit the patient was educated and counseled about appropriate screening and preventive services including : fall prevention , diabetes screening, nutrition counseling, colorectal cancer screening, and recommended immunizations.    CC: The primary encounter diagnosis was Breast cancer screening. Diagnoses of Productive cough, Mixed hyperlipidemia, Acute non-recurrent maxillary sinusitis, and Encounter for preventive measure were also pertinent to this visit.  Recurrent sinus pain and productive cough. Fever of 101 on Saturday neck glands swollen   History : treated Dec 3 by LG with doxy, tussionex  For 10 day history of cough and congestion.  Stated during vacation in [place]. HAS NOT IMPROVED   Has missed several   Days  of work Friday Tuesday  and Wednesday  [name] has a past medical history of Allergy, Benign neoplasm of breast (2011), Bursitis (2011), History of migraine headaches, Plantar fasciitis of left foot, and Pleurisy (Jan 2013).   She has a past surgical history that includes Abdominal hysterectomy (2006); Elbow surgery (2005); and Breast biopsy (Bilateral, 0175,1025).   Her family history includes Brain cancer (age of onset: 9) in her mother; Breast cancer (age of onset: 73) in her maternal grandmother; Cancer (age of onset: 48) in her maternal grandmother; Heart disease in her father and sister.She reports that she quit smoking  about 7 years ago. She has never used smokeless tobacco. She reports current alcohol use. She reports that she does not use drugs.  Outpatient Medications Prior to Visit  Medication Sig Dispense Refill  . albuterol (PROVENTIL HFA;VENTOLIN HFA) 108 (90 Base) MCG/ACT  inhaler Inhale 2 puffs into the lungs every 6 (six) hours as needed for wheezing or shortness of breath. 1 Inhaler 2  . cholecalciferol (VITAMIN D3) 25 MCG (1000 UT) tablet Take 1,000 Units by mouth daily.    Marland Kitchen escitalopram (LEXAPRO) 10 MG tablet TAKE ONE (1) TABLET BY MOUTH ONCE DAILY 30 tablet 2  . fexofenadine (ALLEGRA) 180 MG tablet Take 1 tablet (180 mg total) by mouth daily. 90 tablet 1  . mometasone (NASONEX) 50 MCG/ACT nasal spray Place 2 sprays into the nose daily. 17 g 12  . Phenylephrine HCl (NASAL DECONGESTANT PE PO) Take 1 tablet by mouth daily.    . valACYclovir (VALTREX) 1000 MG tablet TAKE 1 TABLET BY MOUTH 2 TIMES DAILY FOR1 WEEK, THEN DAILY THEREAFTER 31 tablet 5  . vitamin C (ASCORBIC ACID) 500 MG tablet Take 500 mg by mouth daily.    . chlorpheniramine-HYDROcodone (TUSSIONEX PENNKINETIC ER) 10-8 MG/5ML SUER Take 5 mLs by mouth every 12 (twelve) hours as needed for cough. (Patient not taking: Reported on 12/17/2018) 140 mL 0  . doxycycline (VIBRA-TABS) 100 MG tablet Take 1 tablet (100 mg total) by mouth 2 (two) times daily. (Patient not taking: Reported on 12/17/2018) 14 tablet 0  . levofloxacin (LEVAQUIN) 500 MG tablet Take 1 tablet (500 mg total) by mouth daily. (Patient not taking: Reported on 12/17/2018) 7 tablet 0   No facility-administered medications prior to visit.     Review of Systems   Patient denies headache, fevers, malaise, unintentional weight loss, skin rash, eye pain, sinus congestion and sinus pain, sore throat, dysphagia,  hemoptysis , cough, dyspnea, wheezing, chest pain, palpitations, orthopnea, edema, abdominal pain, nausea, melena, diarrhea, constipation, flank pain, dysuria, hematuria, urinary  Frequency, nocturia, numbness, tingling, seizures,  Focal weakness, Loss of consciousness,  Tremor, insomnia, depression, anxiety, and suicidal ideation.      Objective:  BP 138/82 (BP Location: Right Arm, Patient Position: Sitting, Cuff Size: Normal)   Pulse 80    Temp 98.4 F (36.9 C) (Oral)   Resp 15   Ht 5\' 7"  (1.702 m)   Wt 177 lb 3.2 oz (80.4 kg)   SpO2 96%   BMI 27.75 kg/m   Physical Exam   General appearance: alert, cooperative and appears stated age Ears: normal TM's and external ear canals both ears Throat: lips, mucosa, and tongue normal; teeth and gums normal Neck: no adenopathy, no carotid bruit, supple, symmetrical, trachea midline and thyroid not enlarged, symmetric, no tenderness/mass/nodules Back: symmetric, no curvature. ROM normal. No CVA tenderness. Lungs: clear to auscultation bilaterally Heart: regular rate and rhythm, S1, S2 normal, no murmur, click, rub or gallop Abdomen: soft, non-tender; bowel sounds normal; no masses,  no organomegaly Pulses: 2+ and symmetric Skin: Skin color, texture, turgor normal. No rashes or lesions Lymph nodes: Cervical, supraclavicular, and axillary nodes normal.    Assessment & Plan:   Problem List Items Addressed This Visit    Acute sinusitis    Given chronicity of symptoms, development of facial pain and exam consistent with bacterial URI,  Will treat with empiric antibiotics, decongestants, and saline lavage.       Relevant Medications   cefdinir (OMNICEF) 300 MG capsule   chlorpheniramine-HYDROcodone (TUSSIONEX PENNKINETIC ER) 10-8 MG/5ML  SUER   fluconazole (DIFLUCAN) 150 MG tablet   Encounter for preventive measure    Annual comprehensive preventive exam was done as well as an evaluation and management of chronic conditions .  During the course of the visit the patient was educated and counseled about appropriate screening and preventive services including :  diabetes screening, lipid analysis with projected  10 year  risk for CAD , nutrition counseling, breast, cervical and colorectal cancer screening, and recommended immunizations.  Printed recommendations for health maintenance screenings was give       Other Visit Diagnoses    Breast cancer screening    -  Primary    Relevant Orders   MM 3D SCREEN BREAST BILATERAL   Productive cough       Relevant Medications   chlorpheniramine-HYDROcodone (TUSSIONEX PENNKINETIC ER) 10-8 MG/5ML SUER   Mixed hyperlipidemia       Relevant Orders   Lipid panel (Completed)   Comprehensive metabolic panel (Completed)   TSH (Completed)      I have discontinued Karen Chafe. Haik "Janine"'s levofloxacin and doxycycline. I am also having her start on cefdinir and fluconazole. Additionally, I am having her maintain her Phenylephrine HCl (NASAL DECONGESTANT PE PO), fexofenadine, vitamin C, mometasone, valACYclovir, albuterol, escitalopram, cholecalciferol, and chlorpheniramine-HYDROcodone.  Meds ordered this encounter  Medications  . cefdinir (OMNICEF) 300 MG capsule    Sig: Take 1 capsule (300 mg total) by mouth 2 (two) times daily.    Dispense:  28 capsule    Refill:  0  . chlorpheniramine-HYDROcodone (TUSSIONEX PENNKINETIC ER) 10-8 MG/5ML SUER    Sig: Take 5 mLs by mouth every 12 (twelve) hours as needed for cough.    Dispense:  140 mL    Refill:  0    Patient has taken this medication in the past without issues.  . fluconazole (DIFLUCAN) 150 MG tablet    Sig: Take 1 tablet (150 mg total) by mouth daily.    Dispense:  2 tablet    Refill:  0    Medications Discontinued During This Encounter  Medication Reason  . chlorpheniramine-HYDROcodone (TUSSIONEX PENNKINETIC ER) 10-8 MG/5ML SUER Patient has not taken in last 30 days  . doxycycline (VIBRA-TABS) 100 MG tablet Completed Course  . levofloxacin (LEVAQUIN) 500 MG tablet Completed Course    Follow-up: No follow-ups on file.   Crecencio Mc, MD

## 2018-12-17 NOTE — Patient Instructions (Addendum)
Iam treating you for bacterial sinusitis which is  a  complication from your persistent congestion    I am prescribing an antibiotic (levaquin)  For 2 weeks    To manage the infection  I also advise using Tussionex for the cough at night  Along with Delsym for the daytime cough .   Once you stop the tussionex,  You can Take generic OTC benadryl 25 mg at bedtime  for the drainage,  Allegra during the day,  Sudafed PE 20 mg every 6 hours  if needed for congestion,   Please take a probiotic ( Align, Flora que or Culturelle) OR A GENERIC EQUIVALENT for 4  weeks since you are taking an  antibiotic to prevent a very serious antibiotic associated infection  Called clostridium dificile colitis that can cause diarrhea, multi organ failure, sepsis and death if not managed.  (or eat yogurt daily )   Your annual mammogram has been ordered.  You are encouraged (required) to call to make your appointment at [facility]  Your next cologuard test is due in 2022    The new goals for optimal blood pressure management are 120/70.  Please check your blood pressure a few times at home [place] and send me the readings so I can determine if you need  To start  medication    Health Maintenance for Postmenopausal Women Menopause is a normal process in which your reproductive ability comes to an end. This process happens gradually over a span of months to years, usually between the ages of 86 and 75. Menopause is complete when you have missed 12 consecutive menstrual periods. It is important to talk with your health care provider about some of the most common conditions that affect postmenopausal women, such as heart disease, cancer, and bone loss (osteoporosis). Adopting a healthy lifestyle and getting preventive care can help to promote your health and wellness. Those actions can also lower your chances of developing some of these common conditions. What should I know about  menopause? During menopause, you may experience a number of symptoms, such as:  Moderate-to-severe hot flashes.  Night sweats.  Decrease in sex drive.  Mood swings.  Headaches.  Tiredness.  Irritability.  Memory problems.  Insomnia. Choosing to treat or not to treat menopausal changes is an individual decision that you make with your health care provider. What should I know about hormone replacement therapy and supplements? Hormone therapy products are effective for treating symptoms that are associated with menopause, such as hot flashes and night sweats. Hormone replacement carries certain risks, especially as you become older. If you are thinking about using estrogen or estrogen with progestin treatments, discuss the benefits and risks with your health care provider. What should I know about heart disease and stroke? Heart disease, heart attack, and stroke become more likely as you age. This may be due, in part, to the hormonal changes that your body experiences during menopause. These can affect how your body processes dietary fats, triglycerides, and cholesterol. Heart attack and stroke are both medical emergencies. There are many things that you can do to help prevent heart disease and stroke:  Have your blood pressure checked at least every 1-2 years. High blood pressure causes heart disease and increases the risk of stroke.  If you are 70-8 years old, ask your health care provider if you should take aspirin to prevent a heart attack or a stroke.  Do not use any tobacco products,  including cigarettes, chewing tobacco, or electronic cigarettes. If you need help quitting, ask your health care provider.  It is important to eat a healthy diet and maintain a healthy weight. ? Be sure to include plenty of vegetables, fruits, low-fat dairy products, and lean protein. ? Avoid eating foods that are high in solid fats, added sugars, or salt (sodium).  Get regular exercise. This is  one of the most important things that you can do for your health. ? Try to exercise for at least 150 minutes each week. The type of exercise that you do should increase your heart rate and make you sweat. This is known as moderate-intensity exercise. ? Try to do strengthening exercises at least twice each week. Do these in addition to the moderate-intensity exercise.  Know your numbers.Ask your health care provider to check your cholesterol and your blood glucose. Continue to have your blood tested as directed by your health care provider.  What should I know about cancer screening? There are several types of cancer. Take the following steps to reduce your risk and to catch any cancer development as early as possible. Breast Cancer  Practice breast self-awareness. ? This means understanding how your breasts normally appear and feel. ? It also means doing regular breast self-exams. Let your health care provider know about any changes, no matter how small.  If you are 28 or older, have a clinician do a breast exam (clinical breast exam or CBE) every year. Depending on your age, family history, and medical history, it may be recommended that you also have a yearly breast X-ray (mammogram).  If you have a family history of breast cancer, talk with your health care provider about genetic screening.  If you are at high risk for breast cancer, talk with your health care provider about having an MRI and a mammogram every year.  Breast cancer (BRCA) gene test is recommended for women who have family members with BRCA-related cancers. Results of the assessment will determine the need for genetic counseling and BRCA1 and for BRCA2 testing. BRCA-related cancers include these types: ? Breast. This occurs in males or females. ? Ovarian. ? Tubal. This may also be called fallopian tube cancer. ? Cancer of the abdominal or pelvic lining (peritoneal cancer). ? Prostate. ? Pancreatic. Cervical, Uterine, and  Ovarian Cancer Your health care provider may recommend that you be screened regularly for cancer of the pelvic organs. These include your ovaries, uterus, and vagina. This screening involves a pelvic exam, which includes checking for microscopic changes to the surface of your cervix (Pap test).  For women ages 21-65, health care providers may recommend a pelvic exam and a Pap test every three years. For women ages 29-65, they may recommend the Pap test and pelvic exam, combined with testing for human papilloma virus (HPV), every five years. Some types of HPV increase your risk of cervical cancer. Testing for HPV may also be done on women of any age who have unclear Pap test results.  Other health care providers may not recommend any screening for nonpregnant women who are considered low risk for pelvic cancer and have no symptoms. Ask your health care provider if a screening pelvic exam is right for you.  If you have had past treatment for cervical cancer or a condition that could lead to cancer, you need Pap tests and screening for cancer for at least 20 years after your treatment. If Pap tests have been discontinued for you, your risk factors (such as  having a new sexual partner) need to be reassessed to determine if you should start having screenings again. Some women have medical problems that increase the chance of getting cervical cancer. In these cases, your health care provider may recommend that you have screening and Pap tests more often.  If you have a family history of uterine cancer or ovarian cancer, talk with your health care provider about genetic screening.  If you have vaginal bleeding after reaching menopause, tell your health care provider.  There are currently no reliable tests available to screen for ovarian cancer. Lung Cancer Lung cancer screening is recommended for adults 34-57 years old who are at high risk for lung cancer because of a history of smoking. A yearly low-dose CT  scan of the lungs is recommended if you:  Currently smoke.  Have a history of at least 30 pack-years of smoking and you currently smoke or have quit within the past 15 years. A pack-year is smoking an average of one pack of cigarettes per day for one year. Yearly screening should:  Continue until it has been 15 years since you quit.  Stop if you develop a health problem that would prevent you from having lung cancer treatment. Colorectal Cancer  This type of cancer can be detected and can often be prevented.  Routine colorectal cancer screening usually begins at age 69 and continues through age 58.  If you have risk factors for colon cancer, your health care provider may recommend that you be screened at an earlier age.  If you have a family history of colorectal cancer, talk with your health care provider about genetic screening.  Your health care provider may also recommend using home test kits to check for hidden blood in your stool.  A small camera at the end of a tube can be used to examine your colon directly (sigmoidoscopy or colonoscopy). This is done to check for the earliest forms of colorectal cancer.  Direct examination of the colon should be repeated every 5-10 years until age 1. However, if early forms of precancerous polyps or small growths are found or if you have a family history or genetic risk for colorectal cancer, you may need to be screened more often. Skin Cancer  Check your skin from head to toe regularly.  Monitor any moles. Be sure to tell your health care provider: ? About any new moles or changes in moles, especially if there is a change in a mole's shape or color. ? If you have a mole that is larger than the size of a pencil eraser.  If any of your family members has a history of skin cancer, especially at a young age, talk with your health care provider about genetic screening.  Always use sunscreen. Apply sunscreen liberally and repeatedly throughout  the day.  Whenever you are outside, protect yourself by wearing long sleeves, pants, a wide-brimmed hat, and sunglasses. What should I know about osteoporosis? Osteoporosis is a condition in which bone destruction happens more quickly than new bone creation. After menopause, you may be at an increased risk for osteoporosis. To help prevent osteoporosis or the bone fractures that can happen because of osteoporosis, the following is recommended:  If you are 68-67 years old, get at least 1,000 mg of calcium and at least 600 mg of vitamin D per day.  If you are older than age 34 but younger than age 6, get at least 1,200 mg of calcium and at least 600 mg of  vitamin D per day.  If you are older than age 48, get at least 1,200 mg of calcium and at least 800 mg of vitamin D per day. Smoking and excessive alcohol intake increase the risk of osteoporosis. Eat foods that are rich in calcium and vitamin D, and do weight-bearing exercises several times each week as directed by your health care provider. What should I know about how menopause affects my mental health? Depression may occur at any age, but it is more common as you become older. Common symptoms of depression include:  Low or sad mood.  Changes in sleep patterns.  Changes in appetite or eating patterns.  Feeling an overall lack of motivation or enjoyment of activities that you previously enjoyed.  Frequent crying spells. Talk with your health care provider if you think that you are experiencing depression. What should I know about immunizations? It is important that you get and maintain your immunizations. These include:  Tetanus, diphtheria, and pertussis (Tdap) booster vaccine.  Influenza every year before the flu season begins.  Pneumonia vaccine.  Shingles vaccine. Your health care provider may also recommend other immunizations. This information is not intended to replace advice given to you by your health care provider.  Make sure you discuss any questions you have with your health care provider. Document Released: 01/04/2006 Document Revised: 06/01/2016 Document Reviewed: 08/16/2015 Elsevier Interactive Patient Education  2019 [employer].

## 2018-12-18 ENCOUNTER — Ambulatory Visit: Payer: Self-pay

## 2018-12-18 DIAGNOSIS — K521 Toxic gastroenteritis and colitis: Secondary | ICD-10-CM

## 2018-12-18 DIAGNOSIS — T3695XA Adverse effect of unspecified systemic antibiotic, initial encounter: Principal | ICD-10-CM

## 2018-12-18 NOTE — Telephone Encounter (Signed)
Called and spoke to pt.  She reports a history of Meniere's many years ago.  No other history of vertigo or inner ear.  She has been having increased sinus congestion, drainage, etc.  Congestion symptoms same as when seen yesterday.  Started taking omnicef.  Since this am, started having dizziness.  Was questioning if omnicef could be the cause of the dizziness.  Still with head congestoin.  States feels like some of the drainage is drying up.  Using nasacort.  Dizziness worse if up and moving.  No vomiting.  Some decreased appetite, but has been eating and drinking today.  Does not feel needs evaluation this pm.  Feel dizziness more related to her current sinus pressure, congestion, etx.  Plans to continue abx and current medications.  No headache.  Will call with update tomorrow.

## 2018-12-18 NOTE — Telephone Encounter (Signed)
Pt. Reports after she started her antibiotic, she started feeling lightheaded. States "I feel drunk."  States "it is not from the cough medicine because I've had it before and it never made me feel bad." States she can't drive a car like this. Feels like the antibiotic needs to be changed. Please advise pt.  Answer Assessment - Initial Assessment Questions 1. SYMPTOMS: "Do you have any symptoms?"     Lightheaded after starting antibiotic "Feels drunk." It is not from the Tussinex 2. SEVERITY: If symptoms are present, ask "Are they mild, moderate or severe?"     Moderate to severe  Protocols used: MEDICATION QUESTION CALL-A-AH

## 2018-12-18 NOTE — Telephone Encounter (Signed)
Spoke with pt and she is taking the omnicef 300mg  BID and using Nasonex every night. Pt stated that the dizziness that she is experiencing is feeling like she is "drunk", has "water sloshing around in my head". She stated that she does not feel safe to drive right now but that she can still get up and move around the house without any problems. Pt stated that she has only gotten two doses of the antibiotic in so far because she started taking it last night but that she does feel like things are starting to dry up and is wondering if the "weird feeling" is coming from that?

## 2018-12-18 NOTE — Telephone Encounter (Signed)
Please confirm abx she is taking.  It appears she is taking omnicef - per medication record.  This abx usually does not cause significant dizziness.  Her dizziness could be related to her infection, tussionex or possibly vertigo.  Let her know that Dr Derrel Nip is not in the office.  Is she using any nasal spray (for ex, nasacort, etc).  If this is new and acute symptom, may need to be reevaluated.  Please also forward to Dr Derrel Nip for [facility].

## 2018-12-18 NOTE — Assessment & Plan Note (Signed)
Annual comprehensive preventive exam was done as well as an evaluation and management of chronic conditions .  During the course of the visit the patient was educated and counseled about appropriate screening and preventive services including :  diabetes screening, lipid analysis with projected  10 year  risk for CAD , nutrition counseling, breast, cervical and colorectal cancer screening, and recommended immunizations.  Printed recommendations for health maintenance screenings was give 

## 2018-12-18 NOTE — Assessment & Plan Note (Signed)
Given chronicity of symptoms, development of facial pain and exam consistent with bacterial URI,  Will treat with empiric antibiotics, decongestants, and saline lavage.   

## 2018-12-19 NOTE — Telephone Encounter (Signed)
Spoke with pt and advised her to stop the antibiotic. Pt stated that she is taking the probiotic and will continue taking the probiotic. The pt is wanting to know what she can do for the sinusitis because she is still coughing really bad? Pt also wanted to know if she can take something to try and stop the diarrhea?

## 2018-12-19 NOTE — Addendum Note (Signed)
Addended by: Crecencio Mc on: 12/19/2018 04:21 PM   Modules accepted: Orders

## 2018-12-19 NOTE — Telephone Encounter (Signed)
I Incoming call to patient who inquires if she should continue taking  Ommnical 300mg  .  Patient states that she is having a stool every 20 minutes. States that she cant stay out of the bathroom.  Patient await PCP  Response.

## 2018-12-19 NOTE — Telephone Encounter (Signed)
Message from Scherrie Gerlach sent at 12/19/2018 11:44 AM EST   Summary: New Medication Side Effects   Pt states she does not feel woozy anymore, but her stomach is getting very upset. Would liek a call back. Pt states she was to report back today on how the abx was making her feel.

## 2018-12-19 NOTE — Telephone Encounter (Signed)
I advised patient on below & she stated that she would try using imodium. She also needs work note, but cant come & pick one up. She is calling back on Monday to give [place] fax number so one can be faxed.

## 2018-12-19 NOTE — Telephone Encounter (Signed)
She can  take the tussionex for the cough. And sudafed for the congestion.      I don't want to give her an alternative  Antibiotic Since  she is having watery stools every 20 minutes ,   she may have c difficile colitis, which is  caused by antibiotics .    If the diarrhea doesn't stop in a day using Imodium,   I  recommend that she go to the ER   to have this test run so we can rule it out .  If it is negative I will start her on an alternative abx.

## 2018-12-19 NOTE — Telephone Encounter (Signed)
Tell her to stop the antibiotic.  She will need to have her stool checked for  c dif colitis.  testordered ,  Has she been taking a probiotic?   If she is still having diarrhea o Monday she needs to come in for the stool test.

## 2018-12-19 NOTE — Telephone Encounter (Signed)
   Answer Assessment - Initial Assessment Questions 1. SYMPTOMS: "Do you have any symptoms?"     diarreha 2. SEVERITY: If symptoms are present, ask "Are they mild, moderate or severe?"    Having stools every 20 mminutes  Protocols used: MEDICATION QUESTION CALL-A-AH   This encounter was created in error - please disregard.

## 2018-12-22 ENCOUNTER — Telehealth: Payer: Self-pay

## 2018-12-22 ENCOUNTER — Telehealth: Payer: Self-pay | Admitting: Internal Medicine

## 2018-12-22 ENCOUNTER — Other Ambulatory Visit: Payer: Self-pay | Admitting: Internal Medicine

## 2018-12-22 MED ORDER — LEVOFLOXACIN 500 MG PO TABS: 500.0000 mg | ORAL_TABLET | Freq: Every day | ORAL | 0 refills | Status: DC

## 2018-12-22 NOTE — Telephone Encounter (Signed)
Copied from [place] [phone]. Topic: Quick Communication - See Telephone Encounter >> Dec 22, 2018  8:36 AM Sheran Luz wrote: CRM for notification. See Telephone encounter for: 12/22/18.  Patient states she was advised to contact nurse with an update on diarrhea. Patient states she no longer has loose stools and is requesting an alternate antibiotic sent to pharmacy- See triage note from 1/23.  Pharmacy: [facility] [place] [facility] [place]  [phone] (Phone) [phone] (Fax)

## 2018-12-22 NOTE — Telephone Encounter (Signed)
Attempted to call patient to see how she is doing and how quickly she responded to Imodium. Left message to call back- will send request to PCP to see how she feels about sending in another antibiotic.

## 2018-12-22 NOTE — Telephone Encounter (Signed)
Message already routed to [facility]

## 2018-12-22 NOTE — Telephone Encounter (Signed)
Letter has been printed, signed and faxed. Pt is aware that new antibiotic has been sent in and that she needs to continue taking the probiotic for at least 3 more weeks.

## 2018-12-22 NOTE — Telephone Encounter (Signed)
Ok to send letter,  I will send in an antijbiotic.  CONTINUE THE PROBIOTICS  FOR AT LEAST 3 WEEKS

## 2018-12-22 NOTE — Telephone Encounter (Signed)
Patient states her diarrhea cleared up by Saturday early evening. Patient was being treated for acute sinuitis- she is requesting a different antibiotic and she wants to know if she should continue the probiotics. Patient states her cough has improved on the Delsym. She is still fatigued and would really like to feel better.  (Patient has given her fax number for her doctor's note for Friday coverage- fax- [phone]- just in case didn't get over to her) Please call her back.

## 2018-12-22 NOTE — Telephone Encounter (Signed)
Patient updating fax for the letter requested below. Fax: [phone]  ATTN:Kerington Hortman  DO Not USE [phone]

## 2018-12-22 NOTE — Telephone Encounter (Signed)
Copied from [place] [phone]. Topic: General - Other >> Dec 22, 2018  8:31 AM Sheran Luz wrote: Reason for CRM: Patient called requesting a work note for Friday 01/24. See triage from 01/23.    Please fax note to # [phone] Attn [facility]

## 2018-12-29 ENCOUNTER — Encounter: Payer: Self-pay | Admitting: Internal Medicine

## 2019-01-14 ENCOUNTER — Ambulatory Visit
Admission: RE | Admit: 2019-01-14 | Discharge: 2019-01-14 | Disposition: A | Discharge status: Home / Self Care | Payer: PRIVATE HEALTH INSURANCE | Source: Ambulatory Visit | Attending: Internal Medicine | Admitting: Internal Medicine

## 2019-01-14 DIAGNOSIS — Z1239 Encounter for other screening for malignant neoplasm of breast: Secondary | ICD-10-CM

## 2019-01-14 DIAGNOSIS — R928 Other abnormal and inconclusive findings on diagnostic imaging of breast: Secondary | ICD-10-CM | POA: Diagnosis not present

## 2019-01-14 DIAGNOSIS — Z1231 Encounter for screening mammogram for malignant neoplasm of breast: Secondary | ICD-10-CM | POA: Diagnosis not present

## 2019-01-15 ENCOUNTER — Other Ambulatory Visit: Payer: Self-pay | Admitting: Internal Medicine

## 2019-01-15 DIAGNOSIS — R928 Other abnormal and inconclusive findings on diagnostic imaging of breast: Secondary | ICD-10-CM

## 2019-01-15 DIAGNOSIS — N6489 Other specified disorders of breast: Secondary | ICD-10-CM

## 2019-01-17 ENCOUNTER — Encounter: Payer: Self-pay | Admitting: Internal Medicine

## 2019-01-17 DIAGNOSIS — R928 Other abnormal and inconclusive findings on diagnostic imaging of breast: Secondary | ICD-10-CM | POA: Insufficient documentation

## 2019-01-26 ENCOUNTER — Ambulatory Visit
Admission: RE | Admit: 2019-01-26 | Discharge: 2019-01-26 | Disposition: A | Discharge status: Home / Self Care | Payer: PRIVATE HEALTH INSURANCE | Source: Ambulatory Visit | Attending: Internal Medicine | Admitting: Internal Medicine

## 2019-01-26 DIAGNOSIS — N6489 Other specified disorders of breast: Secondary | ICD-10-CM | POA: Diagnosis present

## 2019-01-26 DIAGNOSIS — R928 Other abnormal and inconclusive findings on diagnostic imaging of breast: Secondary | ICD-10-CM

## 2019-02-12 ENCOUNTER — Other Ambulatory Visit: Payer: Self-pay | Admitting: Internal Medicine

## 2019-05-19 ENCOUNTER — Other Ambulatory Visit: Payer: Self-pay | Admitting: Internal Medicine

## 2019-07-21 ENCOUNTER — Other Ambulatory Visit: Payer: Self-pay | Admitting: Internal Medicine

## 2019-10-19 ENCOUNTER — Other Ambulatory Visit: Payer: Self-pay | Admitting: Internal Medicine

## 2019-11-16 ENCOUNTER — Other Ambulatory Visit: Payer: Self-pay | Admitting: Internal Medicine

## 2019-12-14 ENCOUNTER — Other Ambulatory Visit: Payer: Self-pay | Admitting: Internal Medicine

## 2019-12-16 ENCOUNTER — Encounter: Payer: Self-pay | Admitting: Internal Medicine

## 2019-12-16 ENCOUNTER — Ambulatory Visit (INDEPENDENT_AMBULATORY_CARE_PROVIDER_SITE_OTHER): Payer: PRIVATE HEALTH INSURANCE | Admitting: Internal Medicine

## 2019-12-16 ENCOUNTER — Other Ambulatory Visit: Payer: Self-pay

## 2019-12-16 DIAGNOSIS — Z20822 Contact with and (suspected) exposure to covid-19: Secondary | ICD-10-CM | POA: Insufficient documentation

## 2019-12-16 MED ORDER — TRAMADOL HCL 50 MG PO TABS: 50.0000 mg | ORAL_TABLET | Freq: Four times a day (QID) | ORAL | 0 refills | Status: AC | PRN

## 2019-12-16 MED ORDER — CEFDINIR 300 MG PO CAPS: 300.0000 mg | ORAL_CAPSULE | Freq: Two times a day (BID) | ORAL | 0 refills | Status: DC

## 2019-12-16 MED ORDER — MOMETASONE FUROATE 0.1 % EX CREA: 1.0000 "application " | TOPICAL_CREAM | Freq: Every day | CUTANEOUS | 0 refills | Status: DC

## 2019-12-16 NOTE — Progress Notes (Signed)
Virtual Visit via [email or web address]  This visit type was conducted due to national recommendations for restrictions regarding the COVID-19 pandemic (e.g. social distancing).  This format is felt to be most appropriate for this patient at this time.  All issues noted in this document were discussed and addressed.  No physical exam was performed (except for noted visual exam findings with Video Visits).   I connected with@ on 12/16/19 at  9:30 AM EST by a video enabled telemedicine application or telephone and verified that I am speaking with the correct person using two identifiers. Location patient: home Location provider: work or home office Persons participating in the virtual visit: patient, provider  I discussed the limitations, risks, security and privacy concerns of performing an evaluation and management service by telephone and the availability of in person appointments. I also discussed with the patient that there may be a patient responsible charge related to this service. The patient expressed understanding and agreed to proceed.   Reason for visit: headache,  Sinus congestion   HPI:   59 yr old with multiple family members positive for COVID 19  Now with symptoms of infection after testing negative 6 days ago.    On Monday developed severe body aches ,  Headache and sinus congestion which have persisted despite use of saline irrigations. She denies sinus drainage.  Has been using 800 mg motrin daily and has not had any documented fevers but has had chills and increased hot flashes.     ROS: See pertinent positives and negatives per HPI.  Past Medical History:  Diagnosis Date  . Allergy    managed with saline lavage  . Benign neoplasm of breast 2011  . Bursitis 2011  . History of migraine headaches   . Plantar fasciitis of left foot   . Pleurisy Jan 2013   treated with lorazepam and ibuprofen    Past Surgical History:  Procedure Laterality Date  . ABDOMINAL HYSTERECTOMY  2006   for precancerous cervix  . BREAST BIOPSY Bilateral 2010,2011   neg  . ELBOW SURGERY  2005    Family History  Problem Relation Age of Onset  . Brain cancer Mother 31       metastic lung CA (presumed)  . Heart disease Father        complications of paralysis  . Cancer Maternal Grandmother 62       breast cancer  . Breast cancer Maternal Grandmother 80  . Heart disease Sister        dilated cardiomyopathy    SOCIAL HX:  reports that she quit smoking about 8 years ago. She has never used smokeless tobacco. She reports current alcohol use. She reports that she does not use drugs.  Current Outpatient Medications:  .  albuterol (PROVENTIL HFA;VENTOLIN HFA) 108 (90 Base) MCG/ACT inhaler, Inhale 2 puffs into the lungs every 6 (six) hours as needed for wheezing or shortness of breath., Disp: 1 Inhaler, Rfl: 2 .  cefdinir (OMNICEF) 300 MG capsule, Take 1 capsule (300 mg total) by mouth 2 (two) times daily., Disp: 28 capsule, Rfl: 0 .  chlorpheniramine-HYDROcodone (TUSSIONEX PENNKINETIC ER) 10-8 MG/5ML SUER, Take 5 mLs by mouth every 12 (twelve) hours as needed for cough., Disp: 140 mL, Rfl: 0 .  cholecalciferol (VITAMIN D3) 25 MCG (1000 UT) tablet, Take 1,000 Units by mouth daily., Disp: , Rfl:  .  escitalopram (LEXAPRO) 10 MG tablet, TAKE (1) TABLET BY MOUTH EVERY DAY, Disp: 30 tablet, Rfl: 0 .  fexofenadine (ALLEGRA)  180 MG tablet, Take 1 tablet (180 mg total) by mouth daily., Disp: 90 tablet, Rfl: 1 .  fluconazole (DIFLUCAN) 150 MG tablet, Take 1 tablet (150 mg total) by mouth daily., Disp: 2 tablet, Rfl: 0 .  levofloxacin (LEVAQUIN) 500 MG tablet, Take 1 tablet (500 mg total) by mouth daily., Disp: 7 tablet, Rfl: 0 .  mometasone (NASONEX) 50 MCG/ACT nasal spray, PLACE 2 SPRAYS INTO THE NOSE DAILY., Disp: 17 g, Rfl: 12 .  Phenylephrine HCl (NASAL DECONGESTANT PE PO), Take 1 tablet by mouth daily., Disp: , Rfl:  .  valACYclovir (VALTREX) 1000 MG tablet, TAKE (1) TABLET BY MOUTH TWICE DAILY  FOR1 WEEK, THEN DAILY THEREAFTER., Disp: 31 tablet, Rfl: 0 .  vitamin C (ASCORBIC ACID) 500 MG tablet, Take 500 mg by mouth daily., Disp: , Rfl:  .  mometasone (ELOCON) 0.1 % cream, Apply 1 application topically daily., Disp: 45 g, Rfl: 0 .  traMADol (ULTRAM) 50 MG tablet, Take 1 tablet (50 mg total) by mouth every 6 (six) hours as needed for up to 7 days., Disp: 28 tablet, Rfl: 0  EXAM:  VITALS per patient if applicable:  GENERAL: alert, oriented, appears well and in no acute distress  HEENT: atraumatic, conjunttiva clear, no obvious abnormalities on inspection of external nose and ears  NECK: normal movements of the head and neck  LUNGS: on inspection no signs of respiratory distress, breathing rate appears normal, no obvious gross SOB, gasping or wheezing  CV: no obvious cyanosis  MS: moves all visible extremities without noticeable abnormality  PSYCH/NEURO: pleasant and cooperative, no obvious depression or anxiety, speech and thought processing grossly intact  ASSESSMENT AND PLAN:  Discussed the following assessment and plan:  Suspected COVID-19 virus infection  Suspected COVID-19 virus infection Infection suspected given symptoms and onset after exposure to infected sister.  Supportive care outlined and work note written.  Quarantine outlined     I discussed the assessment and treatment plan with the patient. The patient was provided an opportunity to ask questions and all were answered. The patient agreed with the plan and demonstrated an understanding of the instructions.   The patient was advised to call back or seek an in-person evaluation if the symptoms worsen or if the condition fails to improve as anticipated.    Crecencio Mc, MD

## 2019-12-16 NOTE — Patient Instructions (Signed)
Vit D 2000 ius daily  Vit C 500 mg   zince 50 mg twice daily   Allegra one daily   You can add up to 2000 mg of acetominophen (tylenol) every day safely  In divided doses (500 mg every 6 hours  Or 1000 mg every 12 hours.) this can be combined  With motrin 800 mg up to three times daily   Quarantining starting on Monday  For 14 days  You should continue your self imposed quantarine until you can answer "yes" to ALL 3 of the conditions below:   1) Your symptoms (fever, cough, shortness of breath) started 7  or more days ago   2) Your body temperature  has been normal for at least 72 hours (WITHOUT the use of any tylenol, motrin or aleve) . Normal is < 100.4 Farenheit  3) your other flu  like symptoms symptoms are getting better.

## 2019-12-16 NOTE — Assessment & Plan Note (Signed)
Infection suspected given symptoms and onset after exposure to infected sister.  Supportive care outlined and work note written.  Quarantine outlined

## 2019-12-16 NOTE — Telephone Encounter (Signed)
Pt called back regarding note that she was given, she states the note said 4 days pt wants to clarify if its 4 or 14 days? Please advise and Thank you!

## 2020-01-13 ENCOUNTER — Other Ambulatory Visit: Payer: Self-pay | Admitting: Internal Medicine

## 2020-02-17 ENCOUNTER — Other Ambulatory Visit: Payer: Self-pay | Admitting: Internal Medicine

## 2020-03-17 ENCOUNTER — Other Ambulatory Visit: Payer: Self-pay | Admitting: Internal Medicine

## 2020-04-18 ENCOUNTER — Other Ambulatory Visit: Payer: Self-pay | Admitting: Internal Medicine

## 2020-06-14 ENCOUNTER — Other Ambulatory Visit: Payer: Self-pay | Admitting: Internal Medicine

## 2020-07-15 ENCOUNTER — Other Ambulatory Visit: Payer: Self-pay | Admitting: Internal Medicine

## 2020-08-25 DIAGNOSIS — Z7189 Other specified counseling: Secondary | ICD-10-CM | POA: Diagnosis not present

## 2020-08-25 DIAGNOSIS — Z23 Encounter for immunization: Secondary | ICD-10-CM | POA: Diagnosis not present

## 2020-09-22 ENCOUNTER — Telehealth (INDEPENDENT_AMBULATORY_CARE_PROVIDER_SITE_OTHER): Payer: BC Managed Care – PPO | Admitting: Family

## 2020-09-22 ENCOUNTER — Other Ambulatory Visit: Payer: Self-pay

## 2020-09-22 ENCOUNTER — Encounter: Payer: Self-pay | Admitting: Family

## 2020-09-22 VITALS — BP 156/86 | HR 78 | Ht 67.0 in | Wt 168.0 lb

## 2020-09-22 DIAGNOSIS — J0100 Acute maxillary sinusitis, unspecified: Secondary | ICD-10-CM

## 2020-09-22 DIAGNOSIS — Z7189 Other specified counseling: Secondary | ICD-10-CM | POA: Diagnosis not present

## 2020-09-22 DIAGNOSIS — Z23 Encounter for immunization: Secondary | ICD-10-CM | POA: Diagnosis not present

## 2020-09-22 MED ORDER — FLUCONAZOLE 150 MG PO TABS: 150.0000 mg | ORAL_TABLET | Freq: Once | ORAL | 1 refills | Status: AC

## 2020-09-22 MED ORDER — DOXYCYCLINE HYCLATE 100 MG PO TABS: 100.0000 mg | ORAL_TABLET | Freq: Two times a day (BID) | ORAL | 0 refills | Status: DC

## 2020-09-22 NOTE — Patient Instructions (Addendum)
STOP sudafed PE and monitor blood pressure to ensure returns to normal.   Hold nasal rinse twice daily and nasonex due to dry nose. If you continue to see blood in nasal congestion , please schedule an appointment for evaluation  Since you are improving, I would advise conservative management with mucinex ( plain) and plenty of water. If you do not continue to improve you may start doxycycline ( antibiotic).Please avoid the sun on this medication  Ensure to take probiotics while on antibiotics and also for 2 weeks after completion. It is important to re-colonize the gut with good bacteria and also to prevent any diarrheal infections associated with antibiotic use.    Nice to meet you!

## 2020-09-22 NOTE — Progress Notes (Signed)
Virtual Visit via Video Note  I connected with@  on 09/22/20 at  2:00 PM EDT by a video enabled telemedicine application and verified that I am speaking with the correct person using two identifiers.  Location patient: home Location provider:work  Persons participating in the virtual visit: patient, provider  I discussed the limitations of evaluation and management by telemedicine and the availability of in person appointments. The patient expressed understanding and agreed to proceed.   HPI: Acute visit Complains of facial pain , mostly on left side which started 2 days ago, improved today. When bends over started to notice that she 'feels pressure'  Endorses nasal congestion . Noted 5 days ago noted congestion from nose with occasional blood specs from both sides, since resolved. Thinks dust contributed over the weekend.   Chronic cough 'from smoking' at baseline. No cp, sob. No ear pain, sore throat,  Negative COVID test with [facility] yesterday She received second covid 65 Moderna today to complete series since she felt better this morning.  Has been taking sudafed PE and nasal rinse twice daily and nasonex with some relief. Warm compresses have been helpful.    H/o seasonal allergies  Former smoker  ROS: See pertinent positives and negatives per HPI.    EXAM:  VITALS per patient if applicable: BP (!) 956/21   Pulse 78   Ht 5\' 7"  (1.702 m)   Wt 168 lb (76.2 kg)   BMI 26.31 kg/m  BP Readings from Last 3 Encounters:  09/22/20 (!) 156/86  12/16/19 132/74  12/17/18 138/82   Wt Readings from Last 3 Encounters:  09/22/20 168 lb (76.2 kg)  12/16/19 168 lb (76.2 kg)  12/17/18 177 lb 3.2 oz (80.4 kg)    GENERAL: alert, oriented, appears well and in no acute distress  HEENT: atraumatic, conjunttiva clear, no obvious abnormalities on inspection of external nose and ears  NECK: normal movements of the head and neck  LUNGS: on inspection no signs of respiratory  distress, breathing rate appears normal, no obvious gross SOB, gasping or wheezing  CV: no obvious cyanosis  MS: moves all visible extremities without noticeable abnormality  PSYCH/NEURO: pleasant and cooperative, no obvious depression or anxiety, speech and thought processing grossly intact  ASSESSMENT AND PLAN:  Discussed the following assessment and plan:  Problem List Items Addressed This Visit      Respiratory   Sinusitis - Primary    Improved today . Negative covid test. Advised to stop Sudafed and monitor blood pressure to ensure returns to normal < 120/80. Advised conservative therapy with mucinex, nasal saline wash.Advised to hold nasal spray due to dryness.  If she fails to continue to improve, I have sent in doxycycline for her to start with probiotics. She will let me know how she is doing.       Relevant Medications   doxycycline (VIBRA-TABS) 100 MG tablet      -we discussed possible serious and likely etiologies, options for evaluation and workup, limitations of telemedicine visit vs in person visit, treatment, treatment risks and precautions. Pt prefers to treat via telemedicine empirically rather then risking or undertaking an in person visit at this moment.  .   I discussed the assessment and treatment plan with the patient. The patient was provided an opportunity to ask questions and all were answered. The patient agreed with the plan and demonstrated an understanding of the instructions.   The patient was advised to call back or seek an in-person evaluation if the  symptoms worsen or if the condition fails to improve as anticipated.   Mable Paris, FNP

## 2020-09-27 NOTE — Assessment & Plan Note (Addendum)
Improved today . Negative covid test. Advised to stop Sudafed and monitor blood pressure to ensure returns to normal < 120/80. Advised conservative therapy with mucinex, nasal saline wash.Advised to hold nasal spray due to dryness.  If she fails to continue to improve, I have sent in doxycycline for her to start with probiotics. She will let me know how she is doing.

## 2020-10-27 ENCOUNTER — Encounter: Payer: Self-pay | Admitting: Internal Medicine

## 2020-10-27 ENCOUNTER — Ambulatory Visit (INDEPENDENT_AMBULATORY_CARE_PROVIDER_SITE_OTHER): Payer: BC Managed Care – PPO | Admitting: Internal Medicine

## 2020-10-27 ENCOUNTER — Other Ambulatory Visit: Payer: Self-pay | Admitting: Internal Medicine

## 2020-10-27 ENCOUNTER — Other Ambulatory Visit: Payer: Self-pay

## 2020-10-27 VITALS — BP 110/66 | HR 73 | Temp 98.3°F | Resp 14 | Ht 67.0 in | Wt 161.2 lb

## 2020-10-27 DIAGNOSIS — T50B95A Adverse effect of other viral vaccines, initial encounter: Secondary | ICD-10-CM

## 2020-10-27 DIAGNOSIS — R5383 Other fatigue: Secondary | ICD-10-CM | POA: Diagnosis not present

## 2020-10-27 DIAGNOSIS — R5382 Chronic fatigue, unspecified: Secondary | ICD-10-CM

## 2020-10-27 DIAGNOSIS — Z1231 Encounter for screening mammogram for malignant neoplasm of breast: Secondary | ICD-10-CM

## 2020-10-27 DIAGNOSIS — E785 Hyperlipidemia, unspecified: Secondary | ICD-10-CM

## 2020-10-27 DIAGNOSIS — G9332 Myalgic encephalomyelitis/chronic fatigue syndrome: Secondary | ICD-10-CM

## 2020-10-27 DIAGNOSIS — Z23 Encounter for immunization: Secondary | ICD-10-CM

## 2020-10-27 DIAGNOSIS — Z0001 Encounter for general adult medical examination with abnormal findings: Secondary | ICD-10-CM

## 2020-10-27 DIAGNOSIS — U099 Post covid-19 condition, unspecified: Secondary | ICD-10-CM

## 2020-10-27 DIAGNOSIS — J301 Allergic rhinitis due to pollen: Secondary | ICD-10-CM

## 2020-10-27 DIAGNOSIS — R198 Other specified symptoms and signs involving the digestive system and abdomen: Secondary | ICD-10-CM

## 2020-10-27 DIAGNOSIS — R053 Chronic cough: Secondary | ICD-10-CM

## 2020-10-27 LAB — COMPREHENSIVE METABOLIC PANEL
ALT: 9 U/L (ref 0–35)
AST: 17 U/L (ref 0–37)
Albumin: 4.3 g/dL (ref 3.5–5.2)
Alkaline Phosphatase: 81 U/L (ref 39–117)
BUN: 15 mg/dL (ref 6–23)
CO2: 28 mEq/L (ref 19–32)
Calcium: 9.5 mg/dL (ref 8.4–10.5)
Chloride: 104 mEq/L (ref 96–112)
Creatinine, Ser: 1.11 mg/dL (ref 0.40–1.20)
GFR: 54.73 mL/min — ABNORMAL LOW (ref >60.00)
Glucose, Bld: 90 mg/dL (ref 70–99)
Potassium: 4.4 mEq/L (ref 3.5–5.1)
Sodium: 140 mEq/L (ref 135–145)
Total Bilirubin: 0.4 mg/dL (ref 0.2–1.2)
Total Protein: 6.7 g/dL (ref 6.0–8.3)

## 2020-10-27 LAB — CBC WITH DIFFERENTIAL/PLATELET
Basophils Absolute: 0.1 10*3/uL (ref 0.0–0.1)
Basophils Relative: 1.9 % (ref 0.0–3.0)
Eosinophils Absolute: 0.3 10*3/uL (ref 0.0–0.7)
Eosinophils Relative: 7 % — ABNORMAL HIGH (ref 0.0–5.0)
HCT: 42 % (ref 36.0–46.0)
Hemoglobin: 13.9 g/dL (ref 12.0–15.0)
Lymphocytes Relative: 25.7 % (ref 12.0–46.0)
Lymphs Abs: 1.2 10*3/uL (ref 0.7–4.0)
MCHC: 33.2 g/dL (ref 30.0–36.0)
MCV: 96.9 fl (ref 78.0–100.0)
Monocytes Absolute: 0.6 10*3/uL (ref 0.1–1.0)
Monocytes Relative: 13 % — ABNORMAL HIGH (ref 3.0–12.0)
Neutro Abs: 2.5 10*3/uL (ref 1.4–7.7)
Neutrophils Relative %: 52.4 % (ref 43.0–77.0)
Platelets: 201 10*3/uL (ref 150.0–400.0)
RBC: 4.34 Mil/uL (ref 3.87–5.11)
RDW: 14.3 % (ref 11.5–15.5)
WBC: 4.7 10*3/uL (ref 4.0–10.5)

## 2020-10-27 LAB — LIPID PANEL
Cholesterol: 261 mg/dL — ABNORMAL HIGH (ref 0–200)
HDL: 45.7 mg/dL (ref >39.00)
LDL Cholesterol: 178 mg/dL — ABNORMAL HIGH (ref 0–99)
NonHDL: 215.18
Total CHOL/HDL Ratio: 6
Triglycerides: 188 mg/dL — ABNORMAL HIGH (ref 0.0–149.0)
VLDL: 37.6 mg/dL (ref 0.0–40.0)

## 2020-10-27 LAB — TSH: TSH: 1.76 u[IU]/mL (ref 0.35–4.50)

## 2020-10-27 LAB — VITAMIN B12: Vitamin B-12: 392 pg/mL (ref 211–911)

## 2020-10-27 MED ORDER — BUSPIRONE HCL 10 MG PO TABS: 10.0000 mg | ORAL_TABLET | Freq: Three times a day (TID) | ORAL | 1 refills | Status: DC

## 2020-10-27 MED ORDER — ACYCLOVIR 800 MG PO TABS: 800.0000 mg | ORAL_TABLET | Freq: Every day | ORAL | 1 refills | Status: DC

## 2020-10-27 NOTE — Assessment & Plan Note (Signed)
Managed with antihistamines avoiding decongestants

## 2020-10-27 NOTE — Progress Notes (Signed)
Patient ID: Ashley [name], female    DOB: 1961/11/16  Age: 59 y.o. MRN: [id number]  The patient is here for annual PREVENTIVE  examination and management of other chronic and acute problems.  lAST cpe jAN 2020   This visit occurred during the SARS-CoV-2 public health emergency.  Safety protocols were in place, including screening questions prior to the visit, additional usage of staff PPE, and extensive cleaning of exam room while observing appropriate contact time as indicated for disinfecting solutions.      The risk factors are reflected in the social history.  The roster of all physicians providing medical care to patient - is listed in the Snapshot section of the chart.  Activities of daily living:  The patient is 100% independent in all ADLs: dressing, toileting, feeding as well as independent mobility  Home safety : The patient has smoke detectors in the home. They wear seatbelts.  There are no firearms at home. There is no violence in the home.   There is no risks for hepatitis, STDs or HIV. There is no   history of blood transfusion. They have no travel history to infectious disease endemic areas of the world.  The patient has seen their dentist in the last six month. They have seen their eye doctor in the last year. They admit to slight hearing difficulty with regard to whispered voices and some television programs.  They have deferred audiologic testing in the last year.  They do not  have excessive sun exposure. Discussed the need for sun protection: hats, long sleeves and use of sunscreen if there is significant sun exposure.   Diet: the importance of a healthy diet is discussed. They do have a healthy diet.  The benefits of regular aerobic exercise were discussed. She walks 4 times per week ,  20 minutes.   Depression screen: there are no signs or vegative symptoms of depression- irritability, change in appetite, anhedonia, sadness/tearfullness.  Cognitive assessment: the patient  manages all their financial and personal affairs and is actively engaged. They could relate day,date,year and events; recalled 2/3 objects at 3 minutes; performed clock-face test normally.  The following portions of the patient's history were reviewed and updated as appropriate: allergies, current medications, past family history, past medical history,  past surgical history, past social history  and problem list.  Visual acuity was not assessed per patient preference since she has regular follow up with her ophthalmologist. Hearing and body mass index were assessed and reviewed.   During the course of the visit the patient was educated and counseled about appropriate screening and preventive services including : fall prevention , diabetes screening, nutrition counseling, colorectal cancer screening, and recommended immunizations.    CC: The primary encounter diagnosis was Fatigue after COVID-19 vaccination. Diagnoses of Fatigue, unspecified type, Dyslipidemia, Non-seasonal allergic rhinitis due to pollen, Encounter for screening mammogram for malignant neoplasm of breast, Need for Tdap vaccination, Encounter for preventive health examination, Post-COVID chronic fatigue, Post-COVID chronic cough, and Teeth clenching were also pertinent to this visit.  FATIGUE SINCE COVID INFECTION AND RECENT VACCINATION IN JAN 2021.    CLENCHES JAW ALL THE TIME SINCE COVID . HAS BOUGHT A MOUTH GUARD . UP TO DATE ON DENTAL EXAMS. No bruxism.   Taking lexapro, but it's no longer working for hot flashes.  Occurring day and night but not as bad as before ,  Not drenching pajamas anymore.Marland Kitchen  n   [name] has a past medical history of Allergy, Benign  neoplasm of breast (2011), Bursitis (2011), History of migraine headaches, Plantar fasciitis of left foot, and Pleurisy (Jan 2013).   She has a past surgical history that includes Abdominal hysterectomy (2006); Elbow surgery (2005); and Breast biopsy (Bilateral,  1610,9604).   Her family history includes Brain cancer (age of onset: 39) in her mother; Breast cancer (age of onset: 45) in her maternal grandmother; Cancer (age of onset: 27) in her maternal grandmother; Heart disease in her father and sister.She reports that she quit smoking about 9 years ago. She has never used smokeless tobacco. She reports current alcohol use. She reports that she does not use drugs.  Outpatient Medications Prior to Visit  Medication Sig Dispense Refill  . cholecalciferol (VITAMIN D3) 25 MCG (1000 UT) tablet Take 1,000 Units by mouth daily.    . fexofenadine (ALLEGRA) 180 MG tablet Take 1 tablet (180 mg total) by mouth daily. 90 tablet 1  . mometasone (NASONEX) 50 MCG/ACT nasal spray PLACE 2 SPRAYS INTO THE NOSE DAILY. 17 g 12  . vitamin C (ASCORBIC ACID) 500 MG tablet Take 500 mg by mouth daily.    Marland Kitchen escitalopram (LEXAPRO) 10 MG tablet TAKE (1) TABLET BY MOUTH EVERY DAY 90 tablet 0  . valACYclovir (VALTREX) 1000 MG tablet TAKE (1) TABLET BY MOUTH TWICE DAILY FOR1 WEEK, THEN DAILY THEREAFTER. 31 tablet 0  . doxycycline (VIBRA-TABS) 100 MG tablet Take 1 tablet (100 mg total) by mouth 2 (two) times daily. (Patient not taking: Reported on 10/27/2020) 10 tablet 0  . mometasone (ELOCON) 0.1 % cream Apply 1 application topically daily. (Patient not taking: Reported on 10/27/2020) 45 g 0  . Phenylephrine HCl (NASAL DECONGESTANT PE PO) Take 1 tablet by mouth daily. (Patient not taking: Reported on 10/27/2020)     No facility-administered medications prior to visit.    Review of Systems   Patient denies headache, fevers, malaise, unintentional weight loss, skin rash, eye pain, sinus congestion and sinus pain, sore throat, dysphagia,  hemoptysis , cough, dyspnea, wheezing, chest pain, palpitations, orthopnea, edema, abdominal pain, nausea, melena, diarrhea, constipation, flank pain, dysuria, hematuria, urinary  Frequency, nocturia, numbness, tingling, seizures,  Focal weakness, Loss of  consciousness,  Tremor, insomnia, depression, anxiety, and suicidal ideation.      Objective:  BP 110/66 (BP Location: Right Arm, Patient Position: Sitting, Cuff Size: Normal)   Pulse 73   Temp 98.3 F (36.8 C) (Oral)   Resp 14   Ht 5\' 7"  (1.702 m)   Wt 161 lb 3.2 oz (73.1 kg)   SpO2 96%   BMI 25.25 kg/m   Physical Exam  General appearance: alert, cooperative and appears stated age Head: Normocephalic, without obvious abnormality, atraumatic Eyes: conjunctivae/corneas clear. PERRL, EOM's intact. Fundi benign. Ears: normal TM's and external ear canals both ears Nose: Nares normal. Septum midline. Mucosa normal. No drainage or sinus tenderness. Throat: lips, mucosa, and tongue normal; teeth and gums normal Neck: no adenopathy, no carotid bruit, no JVD, supple, symmetrical, trachea midline and thyroid not enlarged, symmetric, no tenderness/mass/nodules Lungs: clear to auscultation bilaterally Breasts: normal appearance, no masses or tenderness Heart: regular rate and rhythm, S1, S2 normal, no murmur, click, rub or gallop Abdomen: soft, non-tender; bowel sounds normal; no masses,  no organomegaly Extremities: extremities normal, atraumatic, no cyanosis or edema Pulses: 2+ and symmetric Skin: Skin color, texture, turgor normal. No rashes or lesions Neurologic: Alert and oriented X 3, normal strength and tone. Normal symmetric reflexes. Normal coordination and gait.     Assessment &  Plan:   Problem List Items Addressed This Visit      Unprioritized   Teeth clenching    Trial of buspirone for presumed anxiety       Post-COVID chronic fatigue    Screening labs normal.  No history of snoring.   No symptoms of cardiomyopathy or respiratory  Failure.  Recommended participating in regular exercise program with goal of achieving a minimum of 30 minutes of aerobic activity 5 days per week.   Lab Results  Component Value Date   WBC 4.7 10/27/2020   HGB 13.9 10/27/2020   HCT 42.0  10/27/2020   MCV 96.9 10/27/2020   PLT 201.0 10/27/2020   Lab Results  Component Value Date   IRON 66 10/27/2020   TIBC 332 10/27/2020   FERRITIN 29 10/27/2020   Lab Results  Component Value Date   TSH 1.76 10/27/2020   Lab Results  Component Value Date   VITAMINB12 392 10/27/2020   Lab Results  Component Value Date   CREATININE 1.11 10/27/2020         Post-COVID chronic cough    Trial of benadryl at bedtime since cough sounds moist.  Alternative treatment trial of  atrovent nasal spray if no improvement.       Encounter for preventive health examination    age appropriate education and counseling updated, referrals for preventative services and immunizations addressed, dietary and smoking counseling addressed, most recent labs reviewed.  I have personally reviewed and have noted:  1) the patient's medical and social history 2) The pt's use of alcohol, tobacco, and illicit drugs 3) The patient's current medications and supplements 4) Functional ability including ADL's, fall risk, home safety risk, hearing and visual impairment 5) Diet and physical activities 6) Evidence for depression or mood disorder 7) The patient's height, weight, and BMI have been recorded in the chart  I have made referrals, and provided counseling and education based on review of the above      Allergic rhinitis    Managed with antihistamines avoiding decongestants       Other Visit Diagnoses    Fatigue after COVID-19 vaccination    -  Primary   Fatigue, unspecified type       Relevant Orders   Comprehensive metabolic panel (Completed)   CBC with Differential/Platelet (Completed)   TSH (Completed)   Vitamin B12 (Completed)   Iron, TIBC and Ferritin Panel (Completed)   Dyslipidemia       Relevant Orders   Lipid panel (Completed)   Encounter for screening mammogram for malignant neoplasm of breast       Relevant Orders   MM 3D SCREEN BREAST BILATERAL   Need for Tdap vaccination        Relevant Orders   Tdap vaccine greater than or equal to 7yo IM (Completed)      I have discontinued Karen Chafe. [place]haw "Janine"'s Phenylephrine HCl (NASAL DECONGESTANT PE PO), valACYclovir, and doxycycline. I am also having her start on busPIRone and acyclovir. Additionally, I am having her maintain her fexofenadine, vitamin C, cholecalciferol, and mometasone.  Meds ordered this encounter  Medications  . busPIRone (BUSPAR) 10 MG tablet    Sig: Take 1 tablet (10 mg total) by mouth 3 (three) times daily.    Dispense:  90 tablet    Refill:  1  . acyclovir (ZOVIRAX) 800 MG tablet    Sig: Take 1 tablet (800 mg total) by mouth daily.    Dispense:  90 tablet  Refill:  1    Medications Discontinued During This Encounter  Medication Reason  . doxycycline (VIBRA-TABS) 100 MG tablet   . mometasone (ELOCON) 0.1 % cream   . Phenylephrine HCl (NASAL DECONGESTANT PE PO)   . valACYclovir (VALTREX) 1000 MG tablet     Follow-up: Return in about 3 months (around 01/25/2021).   Crecencio Mc, MD

## 2020-10-27 NOTE — Patient Instructions (Addendum)
Trial of  buspirone 10 mg.  You can take this  2 to 3 times daily for anxiety related to jaw clenching  You can try taking 12.5 to 25 mg benadryl at bedtime to see I it helps the moist cough . If it is too sedating,  Stop it and ask for a nasal spray atrovent   Take melatonin at dinner time instead of bed time  Changing valtrex to acyclovir 800 mg once daily.  Increase to  3 times daily for breakouts    Start a daily exercise program to improve your fatigue  Your annual mammogram has been ordered.  You are encouraged (required) to call to make your appointment at [facility]    Health Maintenance for Postmenopausal Women Menopause is a normal process in which your ability to get pregnant comes to an end. This process happens slowly over many months or years, usually between the ages of 62 and 84. Menopause is complete when you have missed your menstrual periods for 12 months. It is important to talk with your health care provider about some of the most common conditions that affect women after menopause (postmenopausal women). These include heart disease, cancer, and bone loss (osteoporosis). Adopting a healthy lifestyle and getting preventive care can help to promote your health and wellness. The actions you take can also lower your chances of developing some of these common conditions. What should I know about menopause? During menopause, you may get a number of symptoms, such as:  Hot flashes. These can be moderate or severe.  Night sweats.  Decrease in sex drive.  Mood swings.  Headaches.  Tiredness.  Irritability.  Memory problems.  Insomnia. Choosing to treat or not to treat these symptoms is a decision that you make with your health care provider. Do I need hormone replacement therapy?  Hormone replacement therapy is effective in treating symptoms that are caused by menopause, such as hot flashes and night sweats.  Hormone replacement carries certain risks,  especially as you become older. If you are thinking about using estrogen or estrogen with progestin, discuss the benefits and risks with your health care provider. What is my risk for heart disease and stroke? The risk of heart disease, heart attack, and stroke increases as you age. One of the causes may be a change in the body's hormones during menopause. This can affect how your body uses dietary fats, triglycerides, and cholesterol. Heart attack and stroke are medical emergencies. There are many things that you can do to help prevent heart disease and stroke. Watch your blood pressure  High blood pressure causes heart disease and increases the risk of stroke. This is more likely to develop in people who have high blood pressure readings, are of African descent, or are overweight.  Have your blood pressure checked: ? Every 3-5 years if you are 43-85 years of age. ? Every year if you are 57 years old or older. Eat a healthy diet   Eat a diet that includes plenty of vegetables, fruits, low-fat dairy products, and lean protein.  Do not eat a lot of foods that are high in solid fats, added sugars, or sodium. Get regular exercise Get regular exercise. This is one of the most important things you can do for your health. Most adults should:  Try to exercise for at least 150 minutes each week. The exercise should increase your heart rate and make you sweat (moderate-intensity exercise).  Try to do strengthening exercises at least twice  each week. Do these in addition to the moderate-intensity exercise.  Spend less time sitting. Even light physical activity can be beneficial. Other tips  Work with your health care provider to achieve or maintain a healthy weight.  Do not use any products that contain nicotine or tobacco, such as cigarettes, e-cigarettes, and chewing tobacco. If you need help quitting, ask your health care provider.  Know your numbers. Ask your health care provider to check  your cholesterol and your blood sugar (glucose). Continue to have your blood tested as directed by your health care provider. Do I need screening for cancer? Depending on your health history and family history, you may need to have cancer screening at different stages of your life. This may include screening for:  Breast cancer.  Cervical cancer.  Lung cancer.  Colorectal cancer. What is my risk for osteoporosis? After menopause, you may be at increased risk for osteoporosis. Osteoporosis is a condition in which bone destruction happens more quickly than new bone creation. To help prevent osteoporosis or the bone fractures that can happen because of osteoporosis, you may take the following actions:  If you are 54-30 years old, get at least 1,000 mg of calcium and at least 600 mg of vitamin D per day.  If you are older than age 2 but younger than age 33, get at least 1,200 mg of calcium and at least 600 mg of vitamin D per day.  If you are older than age 67, get at least 1,200 mg of calcium and at least 800 mg of vitamin D per day. Smoking and drinking excessive alcohol increase the risk of osteoporosis. Eat foods that are rich in calcium and vitamin D, and do weight-bearing exercises several times each week as directed by your health care provider. How does menopause affect my mental health? Depression may occur at any age, but it is more common as you become older. Common symptoms of depression include:  Low or sad mood.  Changes in sleep patterns.  Changes in appetite or eating patterns.  Feeling an overall lack of motivation or enjoyment of activities that you previously enjoyed.  Frequent crying spells. Talk with your health care provider if you think that you are experiencing depression. General instructions See your health care provider for regular wellness exams and vaccines. This may include:  Scheduling regular health, dental, and eye exams.  Getting and maintaining  your vaccines. These include: ? Influenza vaccine. Get this vaccine each year before the flu season begins. ? Pneumonia vaccine. ? Shingles vaccine. ? Tetanus, diphtheria, and pertussis (Tdap) booster vaccine. Your health care provider may also recommend other immunizations. Tell your health care provider if you have ever been abused or do not feel safe at home. Summary  Menopause is a normal process in which your ability to get pregnant comes to an end.  This condition causes hot flashes, night sweats, decreased interest in sex, mood swings, headaches, or lack of sleep.  Treatment for this condition may include hormone replacement therapy.  Take actions to keep yourself healthy, including exercising regularly, eating a healthy diet, watching your weight, and checking your blood pressure and blood sugar levels.  Get screened for cancer and depression. Make sure that you are up to date with all your vaccines. This information is not intended to replace advice given to you by your health care provider. Make sure you discuss any questions you have with your health care provider. Document Revised: 11/05/2018 Document Reviewed: 11/05/2018 Elsevier Patient  Education  2020 Elsevier Inc.  

## 2020-10-28 LAB — IRON,TIBC AND FERRITIN PANEL
%SAT: 20 % (calc) (ref 16–45)
Ferritin: 29 ng/mL (ref 16–232)
Iron: 66 ug/dL (ref 45–160)
TIBC: 332 mcg/dL (calc) (ref 250–450)

## 2020-10-29 ENCOUNTER — Other Ambulatory Visit: Payer: Self-pay | Admitting: Internal Medicine

## 2020-10-29 DIAGNOSIS — R198 Other specified symptoms and signs involving the digestive system and abdomen: Secondary | ICD-10-CM | POA: Insufficient documentation

## 2020-10-29 DIAGNOSIS — R053 Chronic cough: Secondary | ICD-10-CM | POA: Insufficient documentation

## 2020-10-29 DIAGNOSIS — G9332 Myalgic encephalomyelitis/chronic fatigue syndrome: Secondary | ICD-10-CM | POA: Insufficient documentation

## 2020-10-29 DIAGNOSIS — U099 Post covid-19 condition, unspecified: Secondary | ICD-10-CM | POA: Insufficient documentation

## 2020-10-29 DIAGNOSIS — R944 Abnormal results of kidney function studies: Secondary | ICD-10-CM

## 2020-10-29 NOTE — Progress Notes (Signed)
Your CBC, thyroid , cholesterol,  and liverfunction are normal.  You are not anemic, iron or b12 deficient, .  Your LDL ("bad cholesterol")  has risen  compared to prior testing but is still under the threshold for treatment.  However, There has been a  slight drop in kidney function  (GFR) which is persistent and represents a change compared to last year.  If you have been using aleve or motrin on a regular basis ,  I want you to suspend them completely and return for a repeat blood test on a day when you are well hydrated to have it rechecked.  This should be done prior to your next visit but is not urgent.   Regards,   Deborra Medina, MD

## 2020-10-29 NOTE — Assessment & Plan Note (Signed)
Trial of benadryl at bedtime since cough sounds moist.  Alternative treatment trial of  atrovent nasal spray if no improvement.

## 2020-10-29 NOTE — Assessment & Plan Note (Addendum)
Screening labs normal.  No history of snoring.   No symptoms of cardiomyopathy or respiratory  Failure.  Recommended participating in regular exercise program with goal of achieving a minimum of 30 minutes of aerobic activity 5 days per week.   Lab Results  Component Value Date   WBC 4.7 10/27/2020   HGB 13.9 10/27/2020   HCT 42.0 10/27/2020   MCV 96.9 10/27/2020   PLT 201.0 10/27/2020   Lab Results  Component Value Date   IRON 66 10/27/2020   TIBC 332 10/27/2020   FERRITIN 29 10/27/2020   Lab Results  Component Value Date   TSH 1.76 10/27/2020   Lab Results  Component Value Date   [id number] 174 10/27/2020   Lab Results  Component Value Date   CREATININE 1.11 10/27/2020

## 2020-10-29 NOTE — Assessment & Plan Note (Signed)
Trial of buspirone for presumed anxiety

## 2020-10-29 NOTE — Assessment & Plan Note (Signed)

## 2021-01-05 ENCOUNTER — Ambulatory Visit
Admission: RE | Admit: 2021-01-05 | Discharge: 2021-01-05 | Disposition: A | Discharge status: Home / Self Care | Payer: BC Managed Care – PPO | Source: Ambulatory Visit | Attending: Internal Medicine | Admitting: Internal Medicine

## 2021-01-05 ENCOUNTER — Other Ambulatory Visit: Payer: Self-pay

## 2021-01-05 DIAGNOSIS — Z1231 Encounter for screening mammogram for malignant neoplasm of breast: Secondary | ICD-10-CM | POA: Diagnosis not present

## 2021-01-27 ENCOUNTER — Encounter: Payer: Self-pay | Admitting: Internal Medicine

## 2021-01-27 ENCOUNTER — Other Ambulatory Visit: Payer: Self-pay

## 2021-01-27 ENCOUNTER — Ambulatory Visit: Payer: BC Managed Care – PPO | Admitting: Internal Medicine

## 2021-01-27 VITALS — BP 122/82 | HR 74 | Temp 98.2°F | Resp 15 | Ht 67.0 in | Wt 164.2 lb

## 2021-01-27 DIAGNOSIS — U099 Post covid-19 condition, unspecified: Secondary | ICD-10-CM | POA: Diagnosis not present

## 2021-01-27 DIAGNOSIS — N1831 Chronic kidney disease, stage 3a: Secondary | ICD-10-CM | POA: Diagnosis not present

## 2021-01-27 DIAGNOSIS — R053 Chronic cough: Secondary | ICD-10-CM | POA: Diagnosis not present

## 2021-01-27 DIAGNOSIS — R198 Other specified symptoms and signs involving the digestive system and abdomen: Secondary | ICD-10-CM

## 2021-01-27 NOTE — Patient Instructions (Signed)
You had mild kidney disease  This may be due to use of ibuprofen.  Stop taking any ibuprofen and use tylenol at night 100 mg.   You can  Take up to 2000 mg of acetominophen (tylenol) every day safely  In divided doses (500 mg every 6 hours  Or 1000 mg every 12 hours.)    Return for a repeat lab visit in one month    Chronic Kidney Disease, Adult Chronic kidney disease (CKD) occurs when the kidneys are slowly and permanently damaged over a long period of time. The kidneys are a pair of organs that do many important jobs in the body, including:  Removing waste and extra fluid from the blood to make urine.  Making hormones that maintain the amount of fluid in tissues and blood vessels.  Maintaining the right amount of fluids and chemicals in the body. A small amount of kidney damage may not cause problems, but a large amount of damage may make it hard or impossible for the kidneys to work right. Steps must be taken to slow kidney damage or to stop it from getting worse. If steps are not taken, the kidneys may stop working permanently (end-stage renal disease, or ESRD). Most of the time, CKD does not go away, but it can often be controlled. People who have CKD are usually able to live full lives. What are the causes? The most common causes of this condition are diabetes and high blood pressure (hypertension). Other causes include:  Cardiovascular diseases. These affect the heart and blood vessels.  Kidney diseases. These include: ? Glomerulonephritis, or inflammation of the tiny filters in the kidneys. ? Interstitial nephritis. This is swelling of the small tubes of the kidneys and of the surrounding structures. ? Polycystic kidney disease, in which clusters of fluid-filled sacs form within the kidneys. ? Renal vascular disease. This includes disorders that affect the arteries and veins of the kidneys.  Diseases that affect the body's defense system (immune system).  A problem with urine  flow. This may be caused by: ? Kidney stones. ? Cancer. ? An enlarged prostate, in males.  A kidney infection or urinary tract infection (UTI) that keeps coming back.  Vasculitis. This is swelling or inflammation of the blood vessels. What increases the risk? Your chances of having kidney disease increase with age. The following factors may make you more likely to develop this condition:  A family history of kidney disease or kidney failure. Kidney failure means the kidneys can no longer work right.  Certain genetic diseases.  Taking medicines often that are damaging to the kidneys.  Being around or being in contact with toxic substances.  Obesity.  A history of tobacco use. What are the signs or symptoms? Symptoms of this condition include:  Feeling very tired (lethargic) and having less energy.  Swelling, or edema, of the face, legs, ankles, or feet.  Nausea or vomiting, or loss of appetite.  Confusion or trouble concentrating.  Muscle twitches and cramps, especially in the legs.  Dry, itchy skin.  A metallic taste in the mouth.  Producing less urine, or producing more urine (especially at night).  Shortness of breath.  Trouble sleeping. CKD may also result in not having enough red blood cells or hemoglobin in the blood (anemia) or having weak bones (bone disease). Symptoms develop slowly and may not be obvious until the kidney damage becomes severe. It is possible to have kidney disease for years without having symptoms. How is this diagnosed? This  condition may be diagnosed based on:  Blood tests.  Urine tests.  Imaging tests, such as an ultrasound or a CT scan.  A kidney biopsy. This involves removing a sample of kidney tissue to be looked at under a microscope. Results from these tests will help to determine how serious the CKD is. How is this treated? There is no cure for most cases of this condition, but treatment usually relieves symptoms and  prevents or slows the worsening of the disease. Treatment may include:  Diet changes, which may require you to avoid alcohol and foods that are high in salt, potassium, phosphorous, and protein.  Medicines. These may: ? Lower blood pressure. ? Control blood sugar (glucose). ? Relieve anemia. ? Relieve swelling. ? Protect your bones. ? Improve the balance of salts and minerals in your blood (electrolytes).  Dialysis, which is a type of treatment that removes toxic waste from the body. It may be needed if you have kidney failure.  Managing any other conditions that are causing your CKD or making it worse. Follow these instructions at home: Medicines  Take over-the-counter and prescription medicines only as told by your health care provider. The amount of some medicines that you take may need to be changed.  Do not take any new medicines unless approved by your health care provider. Many medicines can make kidney damage worse.  Do not take any vitamin and mineral supplements unless approved by your health care provider. Many nutritional supplements can make kidney damage worse. Lifestyle  Do not use any products that contain nicotine or tobacco, such as cigarettes, e-cigarettes, and chewing tobacco. If you need help quitting, ask your health care provider.  If you drink alcohol: ? Limit how much you use to:  0-1 drink a day for women who are not pregnant.  0-2 drinks a day for men. ? Know how much alcohol is in your drink. In the [place] one drink equals one 12 oz bottle of beer (355 mL), one 5 oz glass of wine (148 mL), or one 1 oz glass of hard liquor (44 mL).  Maintain a healthy weight. If you need help, ask your health care provider.   General instructions  Follow instructions from your health care provider about eating or drinking restrictions, including any prescribed diet.  Track your blood pressure at home. Report changes in your blood pressure as told.  If you are being  treated for diabetes, track your blood glucose levels as told.  Start or continue an exercise plan. Exercise at least 30 minutes a day, 5 days a week.  Keep your immunizations up to date as told.  Keep all follow-up visits. This is important.   Where to find more information  American Association of Kidney Patients: BombTimer.gl  National Kidney Foundation: www.kidney.[place]: https://mathis.com/  Life Options: www.lifeoptions.org  Kidney School: www.kidneyschool.org Contact a health care provider if:  Your symptoms get worse.  You develop new symptoms. Get help right away if:  You develop symptoms of ESRD. These include: ? Headaches. ? Numbness in your hands or feet. ? Easy bruising. ? Frequent hiccups. ? Chest pain. ? Shortness of breath. ? Lack of menstrual periods, in women.  You have a fever.  You are producing less urine than usual.  You have pain or bleeding when you urinate or when you have a bowel movement. These symptoms may represent a serious problem that is an emergency. Do not wait to see if the symptoms will go  away. Get medical help right away. Call your local emergency services (911 in the [place] Do not drive yourself to the hospital. Summary  Chronic kidney disease (CKD) occurs when the kidneys become damaged slowly over a long period of time.  The most common causes of this condition are diabetes and high blood pressure (hypertension).  There is no cure for most cases of CKD, but treatment usually relieves symptoms and prevents or slows the worsening of the disease. Treatment may include a combination of lifestyle changes, medicines, and dialysis. This information is not intended to replace advice given to you by your health care provider. Make sure you discuss any questions you have with your health care provider. Document Revised: 02/17/2020 Document Reviewed: 02/17/2020 Elsevier Patient Education  [place].

## 2021-01-27 NOTE — Progress Notes (Signed)
Subjective:  Patient ID: Ashley [name], female    DOB: [birth year]  Age: 60 y.o. MRN: [id number]  CC: The primary encounter diagnosis was Stage 3a chronic kidney disease ([place]). Diagnoses of Post-COVID chronic cough and Teeth clenching were also pertinent to this visit.  HPI Ashley [name] presents for follow up on chronic conditions  This visit occurred during the SARS-CoV-2 public health emergency.  Safety protocols were in place, including screening questions prior to the visit, additional usage of staff PPE, and extensive cleaning of exam room while observing appropriate contact time as indicated for disinfecting solutions.  1) Her post covid Cough has resolved.  She works for [employer] ,  Has been back to work , wearing masks in common areas.  Fully vaccinated. Had Ashley [name]  2)  Anxiety:  Taking lexapro In the evening along with Buspirone at night  .   helping at night but taking it in the morning makes her too sleepy on the 45 minute drive to work.    3)  CKD :  Takes 800 mg ibuprofen qhs for years to manage headaches and morning stiffness .  Has not  tried substituting tylenol .  Headaches are migraines :  preceeded by floaters  Triggered by staring at screen too long     Outpatient Medications Prior to Visit  Medication Sig Dispense Refill  . acyclovir (ZOVIRAX) 800 MG tablet Take 1 tablet (800 mg total) by mouth daily. 90 tablet 1  . busPIRone (BUSPAR) 10 MG tablet Take 1 tablet (10 mg total) by mouth 3 (three) times daily. 90 tablet 1  . escitalopram (LEXAPRO) 10 MG tablet TAKE (1) TABLET BY MOUTH EVERY DAY 90 tablet 0  . fexofenadine (ALLEGRA) 180 MG tablet Take 1 tablet (180 mg total) by mouth daily. 90 tablet 1  . mometasone (NASONEX) 50 MCG/ACT nasal spray PLACE 2 SPRAYS INTO THE NOSE DAILY. 17 g 12  . vitamin C (ASCORBIC ACID) 500 MG tablet Take 500 mg by mouth daily.    . cholecalciferol (VITAMIN D3) 25 MCG (1000 UT) tablet Take 1,000 Units  by mouth daily. (Patient not taking: Reported on 01/27/2021)     No facility-administered medications prior to visit.    Review of Systems;  Patient denies headache, fevers, malaise, unintentional weight loss, skin rash, eye pain, sinus congestion and sinus pain, sore throat, dysphagia,  hemoptysis , cough, dyspnea, wheezing, chest pain, palpitations, orthopnea, edema, abdominal pain, nausea, melena, diarrhea, constipation, flank pain, dysuria, hematuria, urinary  Frequency, nocturia, numbness, tingling, seizures,  Focal weakness, Loss of consciousness,  Tremor, insomnia, depression, anxiety, and suicidal ideation.      Objective:  BP 122/82 (BP Location: Right Arm, Patient Position: Sitting, Cuff Size: Normal)   Pulse 74   Temp 98.2 F (36.8 C) (Oral)   Resp 15   Ht 5\' 7"  (1.702 m)   Wt 164 lb 3.2 oz (74.5 kg)   SpO2 99%   BMI 25.72 kg/m   BP Readings from Last 3 Encounters:  01/27/21 122/82  10/27/20 110/66  09/22/20 (!) 156/86    Wt Readings from Last 3 Encounters:  01/27/21 164 lb 3.2 oz (74.5 kg)  10/27/20 161 lb 3.2 oz (73.1 kg)  09/22/20 168 lb (76.2 kg)    General appearance: alert, cooperative and appears stated age Ears: normal TM's and external ear canals both ears Throat: lips, mucosa, and tongue normal; teeth and gums normal Neck: no adenopathy, no carotid bruit, supple,  symmetrical, trachea midline and thyroid not enlarged, symmetric, no tenderness/mass/nodules Back: symmetric, no curvature. ROM normal. No CVA tenderness. Lungs: clear to auscultation bilaterally Heart: regular rate and rhythm, S1, S2 normal, no murmur, click, rub or gallop Abdomen: soft, non-tender; bowel sounds normal; no masses,  no organomegaly Pulses: 2+ and symmetric Skin: Skin color, texture, turgor normal. No rashes or lesions Lymph nodes: Cervical, supraclavicular, and axillary nodes normal.  No results found for: HGBA1C  Lab Results  Component Value Date   CREATININE 1.08 (H)  01/27/2021   CREATININE 1.11 12/02/[name]   CREATININE 1.02 12/17/2018    Lab Results  Component Value Date   WBC 4.7 12/02/[name]   HGB 13.9 12/02/[name]   HCT 42.0 12/02/[name]   PLT 201.0 12/02/[name]   GLUCOSE 84 01/27/2021   CHOL 261 (H) 12/02/[name]   TRIG 188.0 (H) 12/02/[name]   HDL 45.70 12/02/[name]   LDLDIRECT 193.0 12/17/2018   LDLCALC 178 (H) 12/02/[name]   ALT 9 12/02/[name]   AST 17 12/02/[name]   NA 140 01/27/2021   K 4.1 01/27/2021   CL 105 01/27/2021   CREATININE 1.08 (H) 01/27/2021   BUN 16 01/27/2021   CO2 24 01/27/2021   TSH 1.76 12/02/[name]   MICROALBUR <0.2 01/27/2021    MM 3D SCREEN BREAST BILATERAL  Result Date: 01/10/2021 CLINICAL DATA:  Screening. EXAM: DIGITAL SCREENING BILATERAL MAMMOGRAM WITH TOMOSYNTHESIS AND CAD TECHNIQUE: Bilateral screening digital craniocaudal and mediolateral oblique mammograms were obtained. Bilateral screening digital breast tomosynthesis was performed. The images were evaluated with computer-aided detection. COMPARISON:  Previous exam(s). ACR Breast Density Category b: There are scattered areas of fibroglandular density. FINDINGS: There are no findings suspicious for malignancy. IMPRESSION: No mammographic evidence of malignancy. A result letter of this screening mammogram will be mailed directly to the patient. RECOMMENDATION: Screening mammogram in one year. (Code:SM-B-01Y) BI-RADS CATEGORY  1: Negative. Electronically Signed   By: Dorise Bullion III M.D   On: 01/10/2021 13:53    Assessment & Plan:   Problem List Items Addressed This Visit      Unprioritized   CKD (chronic kidney disease) stage 3, GFR 30-59 ml/min ([place]) - Primary    Secondary to NSAID use.   Advised to suspend ibuprofen use   Use tylenol only,  And rtc one month.  Urinary protein and anemia screen normal.   Lab Results  Component Value Date   CREATININE 1.08 (H) 01/27/2021   Lab Results  Component Value Date   NA 140 01/27/2021   K 4.1 01/27/2021   CL 105  01/27/2021   CO2 24 01/27/2021   Lab Results  Component Value Date   MICROALBUR <0.2 01/27/2021     Lab Results  Component Value Date   WBC 4.7 12/02/[name]   HGB 13.9 12/02/[name]   HCT 42.0 12/02/[name]   MCV 96.9 12/02/[name]   PLT 201.0 12/02/[name]  '      Relevant Orders   Renal function panel (Completed)   Microalbumin / creatinine urine ratio (Completed)   Post-COVID chronic cough    Resolved       Teeth clenching    Secondary to anxiety.  Improved with mouth guard and use of buspirone          I am having Ashley Chafe. [place] "Janine" maintain her fexofenadine, vitamin C, cholecalciferol, mometasone, busPIRone, acyclovir, and escitalopram.  No orders of the defined types were placed in this encounter.   There are no discontinued medications.  Follow-up: Return in about 4 weeks (around  02/24/2021).   Crecencio Mc, MD

## 2021-01-28 LAB — RENAL FUNCTION PANEL
Albumin: 4.1 g/dL (ref 3.6–5.1)
BUN/Creatinine Ratio: 15 (calc) (ref 6–22)
BUN: 16 mg/dL (ref 7–25)
CO2: 24 mmol/L (ref 20–32)
Calcium: 9.3 mg/dL (ref 8.6–10.4)
Chloride: 105 mmol/L (ref 98–110)
Creat: 1.08 mg/dL — ABNORMAL HIGH (ref 0.50–1.05)
Glucose, Bld: 84 mg/dL (ref 65–99)
Phosphorus: 3.8 mg/dL (ref 2.5–4.5)
Potassium: 4.1 mmol/L (ref 3.5–5.3)
Sodium: 140 mmol/L (ref 135–146)

## 2021-01-28 LAB — MICROALBUMIN / CREATININE URINE RATIO
Creatinine, Urine: 36 mg/dL (ref 20–275)
Microalb, Ur: <0.2 mg/dL

## 2021-01-29 DIAGNOSIS — N183 Chronic kidney disease, stage 3 unspecified: Secondary | ICD-10-CM | POA: Insufficient documentation

## 2021-01-29 NOTE — Addendum Note (Signed)
Addended by: Crecencio Mc on: 01/29/2021 07:16 PM   Modules accepted: Orders

## 2021-01-29 NOTE — Assessment & Plan Note (Addendum)
Secondary to NSAID use.   Advised to suspend ibuprofen use   Use tylenol only,  And rtc one month.  Urinary protein and anemia screen normal.   Lab Results  Component Value Date   CREATININE 1.08 (H) 01/27/2021   Lab Results  Component Value Date   NA 140 01/27/2021   K 4.1 01/27/2021   CL 105 01/27/2021   CO2 24 01/27/2021   Lab Results  Component Value Date   MICROALBUR <0.2 01/27/2021     Lab Results  Component Value Date   WBC 4.7 10/27/2020   HGB 13.9 10/27/2020   HCT 42.0 10/27/2020   MCV 96.9 10/27/2020   PLT 201.0 10/27/2020  '

## 2021-01-29 NOTE — Assessment & Plan Note (Signed)
Resolved

## 2021-01-29 NOTE — Assessment & Plan Note (Signed)
Secondary to anxiety.  Improved with mouth guard and use of buspirone

## 2021-01-30 MED ORDER — TRAMADOL HCL 50 MG PO TABS: 50.0000 mg | ORAL_TABLET | Freq: Three times a day (TID) | ORAL | 0 refills | Status: AC | PRN

## 2021-02-24 ENCOUNTER — Other Ambulatory Visit (INDEPENDENT_AMBULATORY_CARE_PROVIDER_SITE_OTHER): Payer: BC Managed Care – PPO

## 2021-02-24 ENCOUNTER — Other Ambulatory Visit: Payer: Self-pay

## 2021-02-24 DIAGNOSIS — R944 Abnormal results of kidney function studies: Secondary | ICD-10-CM | POA: Diagnosis not present

## 2021-02-24 DIAGNOSIS — N1831 Chronic kidney disease, stage 3a: Secondary | ICD-10-CM

## 2021-02-24 NOTE — Addendum Note (Signed)
Addended by: Leeanne Rio on: 02/24/2021 01:42 PM   Modules accepted: Orders

## 2021-02-24 NOTE — Addendum Note (Signed)
Addended by: Leeanne Rio on: 02/24/2021 03:24 PM   Modules accepted: Orders

## 2021-02-25 LAB — RENAL FUNCTION PANEL
Albumin: 4.4 g/dL (ref 3.6–5.1)
BUN/Creatinine Ratio: 20 (calc) (ref 6–22)
BUN: 22 mg/dL (ref 7–25)
CO2: 28 mmol/L (ref 20–32)
Calcium: 9.8 mg/dL (ref 8.6–10.4)
Chloride: 105 mmol/L (ref 98–110)
Creat: 1.08 mg/dL — ABNORMAL HIGH (ref 0.50–1.05)
Glucose, Bld: 89 mg/dL (ref 65–99)
Phosphorus: 4.3 mg/dL (ref 2.5–4.5)
Potassium: 4.4 mmol/L (ref 3.5–5.3)
Sodium: 142 mmol/L (ref 135–146)

## 2021-03-27 ENCOUNTER — Other Ambulatory Visit: Payer: Self-pay | Admitting: Internal Medicine

## 2021-03-27 MED ORDER — TRAMADOL HCL 50 MG PO TABS: 50.0000 mg | ORAL_TABLET | Freq: Four times a day (QID) | ORAL | 0 refills | Status: AC | PRN

## 2021-03-31 ENCOUNTER — Other Ambulatory Visit: Payer: Self-pay | Admitting: Internal Medicine

## 2021-06-07 ENCOUNTER — Other Ambulatory Visit: Payer: Self-pay | Admitting: Internal Medicine

## 2021-07-27 ENCOUNTER — Other Ambulatory Visit: Payer: Self-pay | Admitting: Internal Medicine

## 2021-08-04 ENCOUNTER — Encounter: Payer: Self-pay | Admitting: Internal Medicine

## 2021-08-04 ENCOUNTER — Ambulatory Visit: Payer: BC Managed Care – PPO | Admitting: Internal Medicine

## 2021-08-04 ENCOUNTER — Other Ambulatory Visit: Payer: Self-pay

## 2021-08-04 VITALS — BP 102/68 | HR 71 | Temp 96.3°F | Ht 67.0 in | Wt 167.4 lb

## 2021-08-04 DIAGNOSIS — J301 Allergic rhinitis due to pollen: Secondary | ICD-10-CM

## 2021-08-04 DIAGNOSIS — Z1211 Encounter for screening for malignant neoplasm of colon: Secondary | ICD-10-CM

## 2021-08-04 DIAGNOSIS — R198 Other specified symptoms and signs involving the digestive system and abdomen: Secondary | ICD-10-CM

## 2021-08-04 DIAGNOSIS — G43009 Migraine without aura, not intractable, without status migrainosus: Secondary | ICD-10-CM

## 2021-08-04 DIAGNOSIS — R195 Other fecal abnormalities: Secondary | ICD-10-CM

## 2021-08-04 DIAGNOSIS — E663 Overweight: Secondary | ICD-10-CM | POA: Diagnosis not present

## 2021-08-04 DIAGNOSIS — N1831 Chronic kidney disease, stage 3a: Secondary | ICD-10-CM | POA: Diagnosis not present

## 2021-08-04 MED ORDER — BUSPIRONE HCL 10 MG PO TABS: 10.0000 mg | ORAL_TABLET | Freq: Three times a day (TID) | ORAL | 5 refills | Status: DC

## 2021-08-04 MED ORDER — TIZANIDINE HCL 4 MG PO TABS: 4.0000 mg | ORAL_TABLET | Freq: Four times a day (QID) | ORAL | 0 refills | Status: DC | PRN

## 2021-08-04 NOTE — Assessment & Plan Note (Signed)
Secondary to NSAID use.  She has  suspended ibuprofen use   Use tylenol only.   Urinary protein and anemia screen normal.   Lab Results  Component Value Date   CREATININE 1.08 (H) 02/24/2021   Lab Results  Component Value Date   NA 142 02/24/2021   K 4.4 02/24/2021   CL 105 02/24/2021   CO2 28 02/24/2021   Lab Results  Component Value Date   MICROALBUR <0.2 01/27/2021     Lab Results  Component Value Date   WBC 4.7 10/27/2020   HGB 13.9 10/27/2020   HCT 42.0 10/27/2020   MCV 96.9 10/27/2020   PLT 201.0 10/27/2020  '

## 2021-08-04 NOTE — Progress Notes (Signed)
Subjective:  Patient ID: Ashley [name], female    DOB: [birth year]  Age: 60 y.o. MRN: [id number]  CC: The primary encounter diagnosis was Screening for colon cancer. Diagnoses of Stage 3a chronic kidney disease ([place]), Overweight (BMI 25.0-29.9), Non-seasonal allergic rhinitis due to pollen, Migraine without aura and without status migrainosus, not intractable, and Teeth clenching were also pertinent to this visit.  HPI Ashley [name] presents for  Chief Complaint  Patient presents with   Follow-up    6 month follow up    Follow up on GAD managed with lexapro and buspirone   History of migraines;  having one currently.Marland Kitchen  averaging less than one per month.  Advised to try Excedrin migraine ' CKD:  discussed diagnosis,  mild change in GFR over the last yeara.  Not using NSAIDs No proteinuria by last urinalysis in March 2022   GAD: sleeping better  ,  but still clenches teeth . Using a mouth guard at night when  asleep  Clenches teeth  during drive to [place], wakes up with teeth clenched.   Using sudafed PE occasionally for sinus  headaches.  BP   Outpatient Medications Prior to Visit  Medication Sig Dispense Refill   acyclovir (ZOVIRAX) 800 MG tablet Take 1 tablet (800 mg total) by mouth daily. 90 tablet 1   cholecalciferol (VITAMIN D3) 25 MCG (1000 UT) tablet Take 1,000 Units by mouth daily.     escitalopram (LEXAPRO) 10 MG tablet TAKE ONE TABLET BY MOUTH ONCE DAILY 90 tablet 0   fexofenadine (ALLEGRA) 180 MG tablet Take 1 tablet (180 mg total) by mouth daily. 90 tablet 1   mometasone (NASONEX) 50 MCG/ACT nasal spray PLACE 2 SPRAYS INTO THE NOSE DAILY. 17 g 12   Turmeric 400 MG CAPS Take 1 capsule by mouth daily.     vitamin C (ASCORBIC ACID) 500 MG tablet Take 500 mg by mouth daily.     busPIRone (BUSPAR) 10 MG tablet TAKE ONE TABLET BY MOUTH THREE TIMES DAILY 90 tablet 0   No facility-administered medications prior to visit.    Review of Systems;  Patient denies  fevers,  malaise, unintentional weight loss, skin rash, eye pain, sinus congestion and sinus pain, sore throat, dysphagia,  hemoptysis , cough, dyspnea, wheezing, chest pain, palpitations, orthopnea, edema, abdominal pain, nausea, melena, diarrhea, constipation, flank pain, dysuria, hematuria, urinary  Frequency, nocturia, numbness, tingling, seizures,  Focal weakness, Loss of consciousness,  Tremor, insomnia, depression, anxiety, and suicidal ideation.      Objective:  BP 102/68 (BP Location: Left Arm, Patient Position: Sitting, Cuff Size: Normal)   Pulse 71   Temp (!) 96.3 F (35.7 C) (Temporal)   Ht '5\' 7"'$  (1.702 m)   Wt 167 lb 6.4 oz (75.9 kg)   SpO2 98%   BMI 26.22 kg/m   BP Readings from Last 3 Encounters:  08/04/21 102/68  01/27/21 122/82  10/27/20 110/66    Wt Readings from Last 3 Encounters:  08/04/21 167 lb 6.4 oz (75.9 kg)  01/27/21 164 lb 3.2 oz (74.5 kg)  10/27/20 161 lb 3.2 oz (73.1 kg)    General appearance: alert, cooperative and appears stated age Ears: normal TM's and external ear canals both ears Throat: lips, mucosa, and tongue normal; teeth and gums normal Neck: no adenopathy, no carotid bruit, supple, symmetrical, trachea midline and thyroid not enlarged, symmetric, no tenderness/mass/nodules Back: symmetric, no curvature. ROM normal. No CVA tenderness. Lungs: clear to auscultation bilaterally Heart: regular rate and rhythm,  S1, S2 normal, no murmur, click, rub or gallop Abdomen: soft, non-tender; bowel sounds normal; no masses,  no organomegaly Pulses: 2+ and symmetric Skin: Skin color, texture, turgor normal. No rashes or lesions Lymph nodes: Cervical, supraclavicular, and axillary nodes normal. Neuro:  awake and interactive with normal mood and affect. Higher cortical functions are normal. Speech is clear without word-finding difficulty or dysarthria. Extraocular movements are intact. Visual fields of both eyes are grossly intact. Sensation to light touch is  grossly intact bilaterally of upper and lower extremities. Motor examination shows 4+/5 symmetric hand grip and upper extremity and 5/5 lower extremity strength. There is no pronation or drift. Gait is non-ataxic   No results found for: HGBA1C  Lab Results  Component Value Date   CREATININE 1.08 (H) 02/24/2021   CREATININE 1.08 (H) 01/27/2021   CREATININE 1.11 10/27/2020    Lab Results  Component Value Date   WBC 4.7 10/27/2020   HGB 13.9 10/27/2020   HCT 42.0 10/27/2020   PLT 201.0 10/27/2020   GLUCOSE 89 02/24/2021   CHOL 261 (H) 10/27/2020   TRIG 188.0 (H) 10/27/2020   HDL 45.70 10/27/2020   LDLDIRECT 193.0 12/17/2018   LDLCALC 178 (H) 10/27/2020   ALT 9 10/27/2020   AST 17 10/27/2020   NA 142 02/24/2021   K 4.4 02/24/2021   CL 105 02/24/2021   CREATININE 1.08 (H) 02/24/2021   BUN 22 02/24/2021   CO2 28 02/24/2021   TSH 1.76 10/27/2020   MICROALBUR <0.2 01/27/2021    MM 3D SCREEN BREAST BILATERAL  Result Date: 01/10/2021 CLINICAL DATA:  Screening. EXAM: DIGITAL SCREENING BILATERAL MAMMOGRAM WITH TOMOSYNTHESIS AND CAD TECHNIQUE: Bilateral screening digital craniocaudal and mediolateral oblique mammograms were obtained. Bilateral screening digital breast tomosynthesis was performed. The images were evaluated with computer-aided detection. COMPARISON:  Previous exam(s). ACR Breast Density Category b: There are scattered areas of fibroglandular density. FINDINGS: There are no findings suspicious for malignancy. IMPRESSION: No mammographic evidence of malignancy. A result letter of this screening mammogram will be mailed directly to the patient. RECOMMENDATION: Screening mammogram in one year. (Code:SM-B-01Y) BI-RADS CATEGORY  1: Negative. Electronically Signed   By: Dorise Bullion III M.D   On: 01/10/2021 13:53    Assessment & Plan:   Problem List Items Addressed This Visit       Unprioritized   Allergic rhinitis    Managed with allegra daily and prn sudafed pe       Migraine headache    Advised to  Use excedrin migraine      Relevant Medications   tiZANidine (ZANAFLEX) 4 MG tablet   Overweight (BMI 25.0-29.9)    I have congratulated her in reduction of   BMI and encouraged  Continued weight loss with goal of 10% of body weight over the next 6 months using a low glycemic index diet and regular exercise a minimum of 5 days per week.        Teeth clenching    Continue treatment for anxiety,  Adding tizanidine prn       CKD (chronic kidney disease) stage 3, GFR 30-59 ml/min (HCC)    Secondary to NSAID use.  She has  suspended ibuprofen use   Use tylenol only.   Urinary protein and anemia screen normal.   Lab Results  Component Value Date   CREATININE 1.08 (H) 02/24/2021   Lab Results  Component Value Date   NA 142 02/24/2021   K 4.4 02/24/2021   CL 105 02/24/2021  CO2 28 02/24/2021   Lab Results  Component Value Date   MICROALBUR <0.2 01/27/2021     Lab Results  Component Value Date   WBC 4.7 10/27/2020   HGB 13.9 10/27/2020   HCT 42.0 10/27/2020   MCV 96.9 10/27/2020   PLT 201.0 10/27/2020  '      Relevant Orders   Comprehensive metabolic panel   Other Visit Diagnoses     Screening for colon cancer    -  Primary   Relevant Orders   Cologuard       I have changed Ashley Chafe. P[place]ison "Janine"'s busPIRone. I am also having her start on tiZANidine. Additionally, I am having her maintain her fexofenadine, vitamin C, cholecalciferol, mometasone, acyclovir, escitalopram, and Turmeric.  Meds ordered this encounter  Medications   tiZANidine (ZANAFLEX) 4 MG tablet    Sig: Take 1 tablet (4 mg total) by mouth every 6 (six) hours as needed for muscle spasms.    Dispense:  30 tablet    Refill:  0   busPIRone (BUSPAR) 10 MG tablet    Sig: Take 1 tablet (10 mg total) by mouth 3 (three) times daily.    Dispense:  90 tablet    Refill:  5    Medications Discontinued During This Encounter  Medication Reason   busPIRone (BUSPAR) 10 MG  tablet Reorder    Follow-up: Return in about 6 months (around 02/01/2022).   Crecencio Mc, MD

## 2021-08-04 NOTE — Assessment & Plan Note (Signed)
Advised to  Use excedrin migraine

## 2021-08-04 NOTE — Patient Instructions (Addendum)
You may use Excedrin migraine for your migraines   Tizanidine for tension headaches or jaw clenching    Ok to use ibuprofen occasionally (not daily )

## 2021-08-04 NOTE — Assessment & Plan Note (Signed)
Managed with allegra daily and prn sudafed pe

## 2021-08-04 NOTE — Assessment & Plan Note (Signed)
Continue treatment for anxiety,  Adding tizanidine prn

## 2021-08-04 NOTE — Assessment & Plan Note (Signed)
I have congratulated her in reduction of   BMI and encouraged  Continued weight loss with goal of 10% of body weight over the next 6 months using a low glycemic index diet and regular exercise a minimum of 5 days per week.     

## 2021-08-23 DIAGNOSIS — Z1211 Encounter for screening for malignant neoplasm of colon: Secondary | ICD-10-CM | POA: Diagnosis not present

## 2021-08-29 LAB — COLOGUARD: Cologuard: POSITIVE — AB

## 2021-08-31 ENCOUNTER — Other Ambulatory Visit: Payer: Self-pay | Admitting: Internal Medicine

## 2021-08-31 ENCOUNTER — Other Ambulatory Visit: Payer: Self-pay

## 2021-08-31 DIAGNOSIS — R195 Other fecal abnormalities: Secondary | ICD-10-CM

## 2021-08-31 LAB — COLOGUARD: Cologuard: POSITIVE — AB

## 2021-08-31 MED ORDER — TIZANIDINE HCL 4 MG PO TABS: 4.0000 mg | ORAL_TABLET | Freq: Four times a day (QID) | ORAL | 0 refills | Status: DC | PRN

## 2021-09-05 ENCOUNTER — Other Ambulatory Visit: Payer: Self-pay

## 2021-09-05 DIAGNOSIS — R195 Other fecal abnormalities: Secondary | ICD-10-CM

## 2021-09-05 MED ORDER — PEG 3350-KCL-NA BICARB-NACL 420 G PO SOLR: 4000.0000 mL | Freq: Once | ORAL | 0 refills | Status: AC

## 2021-09-05 NOTE — Progress Notes (Signed)
Gastroenterology Pre-Procedure Review  Request Date: 09/28/21 Requesting Physician: Dr. Marius Ditch  PATIENT REVIEW QUESTIONS: The patient responded to the following health history questions as indicated:    1. Are you having any GI issues? no 2. Do you have a personal history of Polyps? no 3. Do you have a family history of Colon Cancer or Polyps? no 4. Diabetes Mellitus? no 5. Joint replacements in the past 12 months?no 6. Major health problems in the past 3 months?no 7. Any artificial heart valves, MVP, or defibrillator?no    MEDICATIONS & ALLERGIES:    Patient reports the following regarding taking any anticoagulation/antiplatelet therapy:   Plavix, Coumadin, Eliquis, Xarelto, Lovenox, Pradaxa, Brilinta, or Effient? no Aspirin? no  Patient confirms/reports the following medications:  Current Outpatient Medications  Medication Sig Dispense Refill   acyclovir (ZOVIRAX) 800 MG tablet Take 1 tablet (800 mg total) by mouth daily. 90 tablet 1   busPIRone (BUSPAR) 10 MG tablet Take 1 tablet (10 mg total) by mouth 3 (three) times daily. 90 tablet 5   cholecalciferol (VITAMIN D3) 25 MCG (1000 UT) tablet Take 1,000 Units by mouth daily.     escitalopram (LEXAPRO) 10 MG tablet TAKE ONE TABLET BY MOUTH ONCE DAILY 90 tablet 0   fexofenadine (ALLEGRA) 180 MG tablet Take 1 tablet (180 mg total) by mouth daily. 90 tablet 1   mometasone (NASONEX) 50 MCG/ACT nasal spray PLACE 2 SPRAYS INTO THE NOSE DAILY. 17 g 12   tiZANidine (ZANAFLEX) 4 MG tablet Take 1 tablet (4 mg total) by mouth every 6 (six) hours as needed for muscle spasms. 30 tablet 0   Turmeric 400 MG CAPS Take 1 capsule by mouth daily.     vitamin C (ASCORBIC ACID) 500 MG tablet Take 500 mg by mouth daily.     No current facility-administered medications for this visit.    Patient confirms/reports the following allergies:  Allergies  Allergen Reactions   Amoxicillin    Anti-Inflammatory Enzyme [Nutritional Supplements]    Bextra  [Valdecoxib] Swelling   Celebrex [Celecoxib] Swelling   Imitrex [Sumatriptan] Other (See Comments)    Chest pain    Percocet [Oxycodone-Acetaminophen] Nausea And Vomiting   Tape Other (See Comments)    blisters   Vioxx [Rofecoxib] Swelling    No orders of the defined types were placed in this encounter.   AUTHORIZATION INFORMATION Primary Insurance: 1D#: Group #:  Secondary Insurance: 1D#: Group #:  SCHEDULE INFORMATION: Date: 09/28/21 Time: Location: [place]

## 2021-09-06 ENCOUNTER — Telehealth: Payer: Self-pay

## 2021-09-06 NOTE — Telephone Encounter (Signed)
Pt. Calling to reschedule procedure 

## 2021-09-07 NOTE — Telephone Encounter (Signed)
Procedure rescheduled for 10/05/21.

## 2021-09-28 ENCOUNTER — Other Ambulatory Visit: Payer: Self-pay | Admitting: Internal Medicine

## 2021-10-02 ENCOUNTER — Telehealth: Payer: Self-pay

## 2021-10-02 NOTE — Telephone Encounter (Signed)
Pt. Calling to reschedule colonoscopy

## 2021-10-02 NOTE — Telephone Encounter (Signed)
Clinic has been informed by patient she is sick  and will need to reschedule procedure. Endo unit has been notified of change. Patient will call back to reschedule procedure.

## 2021-10-05 ENCOUNTER — Encounter: Admission: RE | Payer: Self-pay | Source: Home / Self Care

## 2021-10-05 ENCOUNTER — Ambulatory Visit
Admission: RE | Admit: 2021-10-05 | Payer: BC Managed Care – PPO | Source: Home / Self Care | Admitting: Gastroenterology

## 2021-10-05 SURGERY — COLONOSCOPY WITH PROPOFOL
Anesthesia: General

## 2021-10-22 ENCOUNTER — Other Ambulatory Visit: Payer: Self-pay | Admitting: Internal Medicine

## 2021-10-27 ENCOUNTER — Other Ambulatory Visit: Payer: Self-pay | Admitting: Internal Medicine

## 2021-11-03 ENCOUNTER — Ambulatory Visit (INDEPENDENT_AMBULATORY_CARE_PROVIDER_SITE_OTHER): Payer: BC Managed Care – PPO | Admitting: Internal Medicine

## 2021-11-03 ENCOUNTER — Encounter: Payer: Self-pay | Admitting: Internal Medicine

## 2021-11-03 ENCOUNTER — Other Ambulatory Visit: Payer: Self-pay

## 2021-11-03 VITALS — BP 130/80 | HR 69 | Temp 96.0°F | Ht 67.0 in | Wt 164.6 lb

## 2021-11-03 DIAGNOSIS — Z0000 Encounter for general adult medical examination without abnormal findings: Secondary | ICD-10-CM

## 2021-11-03 DIAGNOSIS — E785 Hyperlipidemia, unspecified: Secondary | ICD-10-CM | POA: Diagnosis not present

## 2021-11-03 DIAGNOSIS — N1831 Chronic kidney disease, stage 3a: Secondary | ICD-10-CM

## 2021-11-03 DIAGNOSIS — R7301 Impaired fasting glucose: Secondary | ICD-10-CM

## 2021-11-03 DIAGNOSIS — R5383 Other fatigue: Secondary | ICD-10-CM

## 2021-11-03 DIAGNOSIS — Z1231 Encounter for screening mammogram for malignant neoplasm of breast: Secondary | ICD-10-CM

## 2021-11-05 NOTE — Assessment & Plan Note (Signed)
Secondary to NSAID use.  She has  suspended ibuprofen use   Use tylenol only.   Urinary protein and anemia screen normal.   Lab Results  Component Value Date   CREATININE 1.08 (H) 02/24/2021   Lab Results  Component Value Date   NA 142 02/24/2021   K 4.4 02/24/2021   CL 105 02/24/2021   CO2 28 02/24/2021   Lab Results  Component Value Date   MICROALBUR <0.2 01/27/2021     Lab Results  Component Value Date   WBC 4.7 10/27/2020   HGB 13.9 10/27/2020   HCT 42.0 10/27/2020   MCV 96.9 10/27/2020   PLT 201.0 10/27/2020  '

## 2021-11-05 NOTE — Progress Notes (Signed)
Patient ID: Ashley [name], female    DOB: 1961/01/30  Age: 60 y.o. MRN: [id number]  The patient is here for annual Medicare wellness examination and management of other chronic and acute problems.   The risk factors are reflected in the social history.  The roster of all physicians providing medical care to patient - is listed in the Snapshot section of the chart.  Activities of daily living:  The patient is 100% independent in all ADLs: dressing, toileting, feeding as well as independent mobility  Home safety : The patient has smoke detectors in the home. They wear seatbelts.  There are no firearms at home. There is no violence in the home.   There is no risks for hepatitis, STDs or HIV. There is no   history of blood transfusion. They have no travel history to infectious disease endemic areas of the world.  The patient has seen their dentist in the last six month. They have seen their eye doctor in the last year. They admit to slight hearing difficulty with regard to whispered voices and some television programs.  They have deferred audiologic testing in the last year.  They do not  have excessive sun exposure. Discussed the need for sun protection: hats, long sleeves and use of sunscreen if there is significant sun exposure.   Diet: the importance of a healthy diet is discussed. They do have a healthy diet.  The benefits of regular aerobic exercise were discussed. Ashley [name] walks 4 times per week ,  20 minutes.   Depression screen: there are no signs or vegative symptoms of depression- irritability, change in appetite, anhedonia, sadness/tearfullness.  Cognitive assessment: the patient manages all their financial and personal affairs and is actively engaged. They could relate day,date,year and events; recalled 2/3 objects at 3 minutes; performed clock-face test normally.  The following portions of the patient's history were reviewed and updated as appropriate: allergies, current medications, past  family history, past medical history,  past surgical history, past social history  and problem list.  Visual acuity was not assessed per patient preference since Ashley [name] has regular follow up with her ophthalmologist. Hearing and body mass index were assessed and reviewed.   During the course of the visit the patient was educated and counseled about appropriate screening and preventive services including : fall prevention , diabetes screening, nutrition counseling, colorectal cancer screening, and recommended immunizations.    CC: The primary encounter diagnosis was Dyslipidemia. Diagnoses of Encounter for preventive health examination, Fatigue, unspecified type, Impaired fasting glucose, Encounter for screening mammogram for malignant neoplasm of breast, and Stage 3a chronic kidney disease ([place]) were also pertinent to this visit.  [name] has a past medical history of Allergy, Benign neoplasm of breast (2011), Bursitis (2011), History of migraine headaches, Plantar fasciitis of left foot, and Pleurisy (Jan 2013).   Ashley [name] has a past surgical history that includes Abdominal hysterectomy (2006); Elbow surgery (2005); and Breast biopsy (Bilateral, 9179,1505).   Her family history includes Brain cancer (age of onset: 97) in her mother; Breast cancer (age of onset: 73) in her maternal grandmother; Cancer (age of onset: 7) in her maternal grandmother; Heart disease in her father and sister.Ashley [name] reports that Ashley [name] quit smoking about 10 years ago. Her smoking use included cigarettes. Ashley [name] has never used smokeless tobacco. Ashley [name] reports current alcohol use. Ashley [name] reports that Ashley [name] does not use drugs.  Outpatient Medications Prior to Visit  Medication Sig Dispense Refill   acyclovir (ZOVIRAX) 800 MG tablet Take 1  tablet (800 mg total) by mouth daily. 90 tablet 1   busPIRone (BUSPAR) 10 MG tablet Take 1 tablet (10 mg total) by mouth 3 (three) times daily. 90 tablet 5   cholecalciferol (VITAMIN D3) 25 MCG (1000 UT)  tablet Take 1,000 Units by mouth daily.     escitalopram (LEXAPRO) 10 MG tablet TAKE ONE TABLET BY MOUTH ONCE DAILY 90 tablet 1   fexofenadine (ALLEGRA) 180 MG tablet Take 1 tablet (180 mg total) by mouth daily. 90 tablet 1   mometasone (NASONEX) 50 MCG/ACT nasal spray PLACE 2 SPRAYS INTO THE NOSE DAILY. 17 g 12   tiZANidine (ZANAFLEX) 4 MG tablet TAKE ONE TABLET BY MOUTH EVERY 6 HOURS AS NEEDED FOR MUSCLE SPASMS 30 tablet 0   Turmeric 400 MG CAPS Take 1 capsule by mouth daily.     vitamin C (ASCORBIC ACID) 500 MG tablet Take 500 mg by mouth daily.     polyethylene glycol-electrolytes (NULYTELY) 420 g solution Take by mouth daily. (Patient not taking: Reported on 11/03/2021)     No facility-administered medications prior to visit.    Review of Systems  Patient denies headache, fevers, malaise, unintentional weight loss, skin rash, eye pain, sinus congestion and sinus pain, sore throat, dysphagia,  hemoptysis , cough, dyspnea, wheezing, chest pain, palpitations, orthopnea, edema, abdominal pain, nausea, melena, diarrhea, constipation, flank pain, dysuria, hematuria, urinary  Frequency, nocturia, numbness, tingling, seizures,  Focal weakness, Loss of consciousness,  Tremor, insomnia, depression, anxiety, and suicidal ideation.     Objective:  BP 130/80 (BP Location: Left Arm, Patient Position: Sitting, Cuff Size: Large)   Pulse 69   Temp (!) 96 F (35.6 C) (Temporal)   Ht '5\' 7"'  (1.702 m)   Wt 164 lb 9.6 oz (74.7 kg)   SpO2 98%   BMI 25.78 kg/m   Physical Exam  General appearance: alert, cooperative and appears stated age Head: Normocephalic, without obvious abnormality, atraumatic Eyes: conjunctivae/corneas clear. PERRL, EOM's intact. Fundi benign. Ears: normal TM's and external ear canals both ears Nose: Nares normal. Septum midline. Mucosa normal. No drainage or sinus tenderness. Throat: lips, mucosa, and tongue normal; teeth and gums normal Neck: no adenopathy, no carotid bruit,  no JVD, supple, symmetrical, trachea midline and thyroid not enlarged, symmetric, no tenderness/mass/nodules Lungs: clear to auscultation bilaterally Breasts: normal appearance, no masses or tenderness Heart: regular rate and rhythm, S1, S2 normal, no murmur, click, rub or gallop Abdomen: soft, non-tender; bowel sounds normal; no masses,  no organomegaly Extremities: extremities normal, atraumatic, no cyanosis or edema Pulses: 2+ and symmetric Skin: Skin color, texture, turgor normal. No rashes or lesions Neurologic: Alert and oriented X 3, normal strength and tone. Normal symmetric reflexes. Normal coordination and gait.     Physical Exam   Assessment & Plan:   Problem List Items Addressed This Visit     Encounter for preventive health examination    age appropriate education and counseling updated, referrals for preventative services and immunizations addressed, dietary and smoking counseling addressed, most recent labs reviewed.  I have personally reviewed and have noted:   1) the patient's medical and social history 2) The pt's use of alcohol, tobacco, and illicit drugs 3) The patient's current medications and supplements 4) Functional ability including ADL's, fall risk, home safety risk, hearing and visual impairment 5) Diet and physical activities 6) Evidence for depression or mood disorder 7) The patient's height, weight, and BMI have been recorded in the chart   I have made referrals, and provided  counseling and education based on review of the above      CKD (chronic kidney disease) stage 3, GFR 30-59 ml/min (HCC)    Secondary to NSAID use.  Ashley [name] has  suspended ibuprofen use   Use tylenol only.   Urinary protein and anemia screen normal.   Lab Results  Component Value Date   CREATININE 1.08 (H) 02/24/2021   Lab Results  Component Value Date   NA 142 02/24/2021   K 4.4 02/24/2021   CL 105 02/24/2021   CO2 28 02/24/2021   Lab Results  Component Value Date    MICROALBUR <0.2 01/27/2021     Lab Results  Component Value Date   WBC 4.7 10/27/2020   HGB 13.9 10/27/2020   HCT 42.0 10/27/2020   MCV 96.9 10/27/2020   PLT 201.0 10/27/2020  '      Other Visit Diagnoses     Dyslipidemia    -  Primary   Relevant Orders   Lipid Profile   Fatigue, unspecified type       Relevant Orders   CBC with Differential/Platelet   TSH   Impaired fasting glucose       Relevant Orders   Comp Met (CMET)   HgB A1c   Encounter for screening mammogram for malignant neoplasm of breast       Relevant Orders   MM 3D SCREEN BREAST BILATERAL       I am having Ashley [name]. Ashley "Ashley [name]" maintain her fexofenadine, vitamin C, cholecalciferol, mometasone, acyclovir, Turmeric, busPIRone, escitalopram, tiZANidine, and polyethylene glycol-electrolytes.  No orders of the defined types were placed in this encounter.   There are no discontinued medications.  Follow-up: No follow-ups on file.   Crecencio Mc, MD

## 2021-11-05 NOTE — Assessment & Plan Note (Signed)

## 2021-11-06 ENCOUNTER — Other Ambulatory Visit (INDEPENDENT_AMBULATORY_CARE_PROVIDER_SITE_OTHER): Payer: BC Managed Care – PPO

## 2021-11-06 ENCOUNTER — Other Ambulatory Visit: Payer: Self-pay

## 2021-11-06 DIAGNOSIS — R5383 Other fatigue: Secondary | ICD-10-CM

## 2021-11-06 DIAGNOSIS — E785 Hyperlipidemia, unspecified: Secondary | ICD-10-CM | POA: Diagnosis not present

## 2021-11-06 DIAGNOSIS — R7301 Impaired fasting glucose: Secondary | ICD-10-CM | POA: Diagnosis not present

## 2021-11-06 LAB — CBC WITH DIFFERENTIAL/PLATELET
Basophils Absolute: 0.1 10*3/uL (ref 0.0–0.1)
Basophils Relative: 1.1 % (ref 0.0–3.0)
Eosinophils Absolute: 0.3 10*3/uL (ref 0.0–0.7)
Eosinophils Relative: 4.5 % (ref 0.0–5.0)
HCT: 42.4 % (ref 36.0–46.0)
Hemoglobin: 13.9 g/dL (ref 12.0–15.0)
Lymphocytes Relative: 28.4 % (ref 12.0–46.0)
Lymphs Abs: 1.8 10*3/uL (ref 0.7–4.0)
MCHC: 32.7 g/dL (ref 30.0–36.0)
MCV: 95.8 fl (ref 78.0–100.0)
Monocytes Absolute: 0.5 10*3/uL (ref 0.1–1.0)
Monocytes Relative: 7.9 % (ref 3.0–12.0)
Neutro Abs: 3.6 10*3/uL (ref 1.4–7.7)
Neutrophils Relative %: 58.1 % (ref 43.0–77.0)
Platelets: 214 10*3/uL (ref 150.0–400.0)
RBC: 4.43 Mil/uL (ref 3.87–5.11)
RDW: 13.5 % (ref 11.5–15.5)
WBC: 6.2 10*3/uL (ref 4.0–10.5)

## 2021-11-06 LAB — COMPREHENSIVE METABOLIC PANEL
ALT: 10 U/L (ref 0–35)
AST: 17 U/L (ref 0–37)
Albumin: 4.2 g/dL (ref 3.5–5.2)
Alkaline Phosphatase: 88 U/L (ref 39–117)
BUN: 14 mg/dL (ref 6–23)
CO2: 28 mEq/L (ref 19–32)
Calcium: 9.6 mg/dL (ref 8.4–10.5)
Chloride: 104 mEq/L (ref 96–112)
Creatinine, Ser: 1.11 mg/dL (ref 0.40–1.20)
GFR: 54.34 mL/min — ABNORMAL LOW (ref >60.00)
Glucose, Bld: 93 mg/dL (ref 70–99)
Potassium: 4.2 mEq/L (ref 3.5–5.1)
Sodium: 141 mEq/L (ref 135–145)
Total Bilirubin: 0.4 mg/dL (ref 0.2–1.2)
Total Protein: 6.6 g/dL (ref 6.0–8.3)

## 2021-11-06 LAB — TSH: TSH: 2.66 u[IU]/mL (ref 0.35–5.50)

## 2021-11-06 LAB — LIPID PANEL
Cholesterol: 272 mg/dL — ABNORMAL HIGH (ref 0–200)
HDL: 54.4 mg/dL (ref >39.00)
LDL Cholesterol: 190 mg/dL — ABNORMAL HIGH (ref 0–99)
NonHDL: 217.51
Total CHOL/HDL Ratio: 5
Triglycerides: 137 mg/dL (ref 0.0–149.0)
VLDL: 27.4 mg/dL (ref 0.0–40.0)

## 2021-11-06 LAB — HEMOGLOBIN A1C: Hgb A1c MFr Bld: 5.9 % (ref 4.6–6.5)

## 2021-11-09 ENCOUNTER — Other Ambulatory Visit: Payer: BC Managed Care – PPO

## 2022-02-15 DIAGNOSIS — J00 Acute nasopharyngitis [common cold]: Secondary | ICD-10-CM | POA: Diagnosis not present

## 2022-03-14 ENCOUNTER — Encounter: Payer: Self-pay | Admitting: Internal Medicine

## 2022-03-27 MED ORDER — ACYCLOVIR 800 MG PO TABS: 800.0000 mg | ORAL_TABLET | Freq: Every day | ORAL | 1 refills | Status: DC

## 2022-03-27 NOTE — Addendum Note (Signed)
Addended by: Adair Laundry on: 03/27/2022 02:42 PM ? ? Modules accepted: Orders ? ?

## 2022-04-12 ENCOUNTER — Other Ambulatory Visit: Payer: Self-pay | Admitting: Internal Medicine

## 2022-05-07 ENCOUNTER — Ambulatory Visit
Admission: RE | Admit: 2022-05-07 | Discharge: 2022-05-07 | Disposition: A | Discharge status: Home / Self Care | Payer: BC Managed Care – PPO | Source: Ambulatory Visit | Attending: Internal Medicine | Admitting: Internal Medicine

## 2022-05-07 DIAGNOSIS — Z1231 Encounter for screening mammogram for malignant neoplasm of breast: Secondary | ICD-10-CM | POA: Diagnosis not present

## 2022-08-27 IMAGING — MG MM DIGITAL SCREENING BILAT W/ TOMO AND CAD
8 series · 8 of 24 positions shown · non-contrast
Comparison: Previous exam(s).

CLINICAL DATA: Screening.

EXAM:
DIGITAL SCREENING BILATERAL MAMMOGRAM WITH TOMOSYNTHESIS AND CAD
TECHNIQUE: Bilateral screening digital craniocaudal and mediolateral oblique
mammograms were obtained. Bilateral screening digital breast
tomosynthesis was performed. The images were evaluated with
computer-aided detection.

[R CC synth-2D]
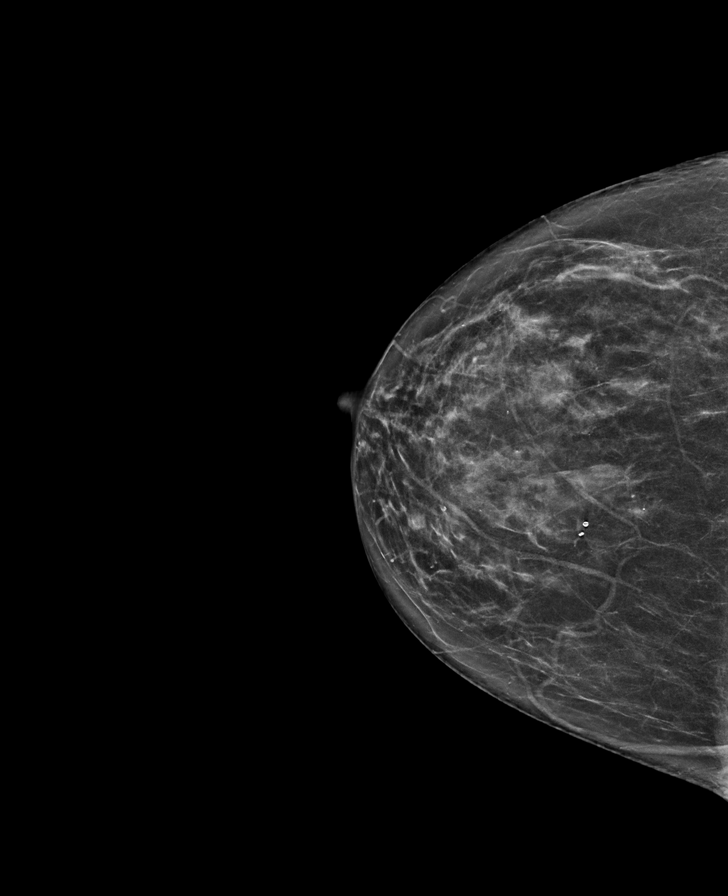

[L CC synth-2D]
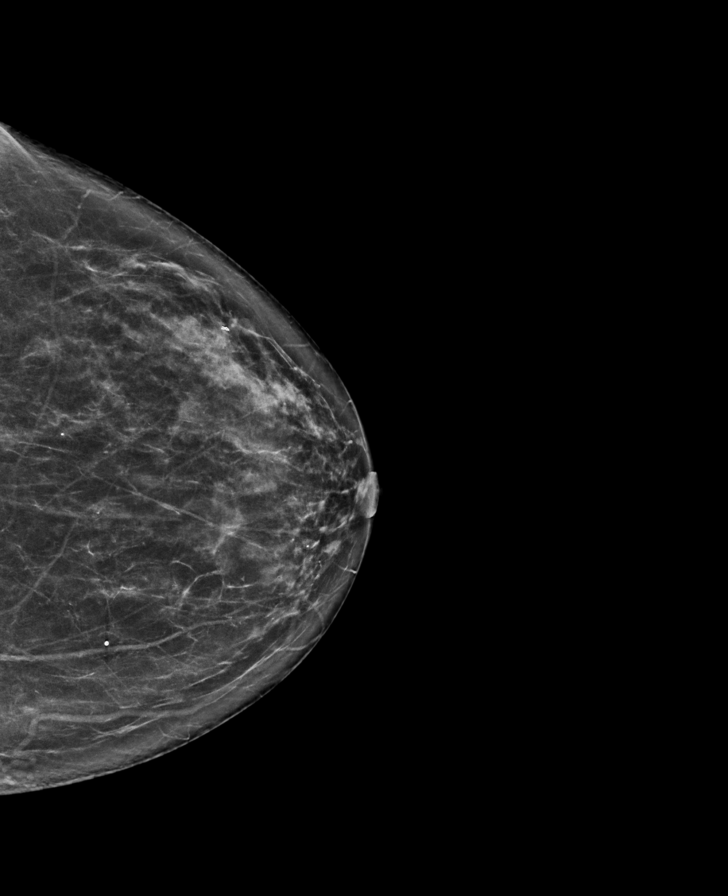

[L MLO synth-2D]
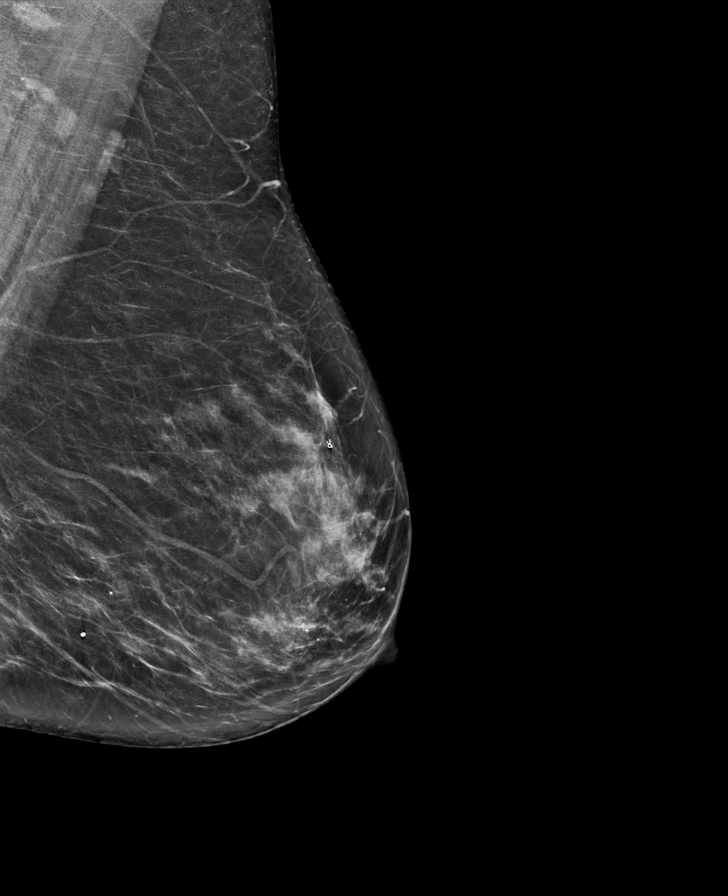

[R MLO synth-2D]
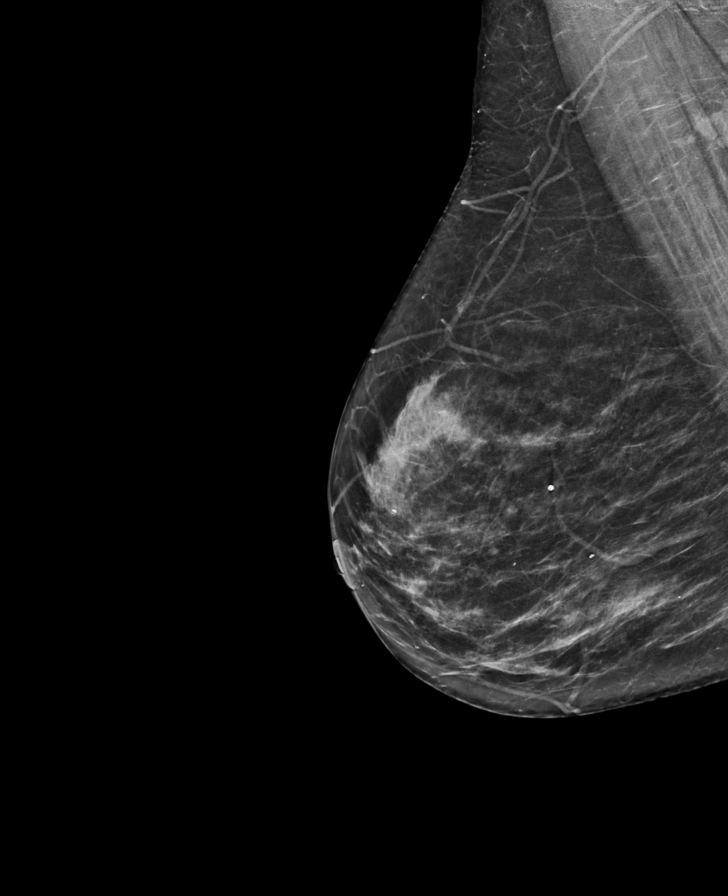

[L MLO tomo · tomo slice 37/73.0]
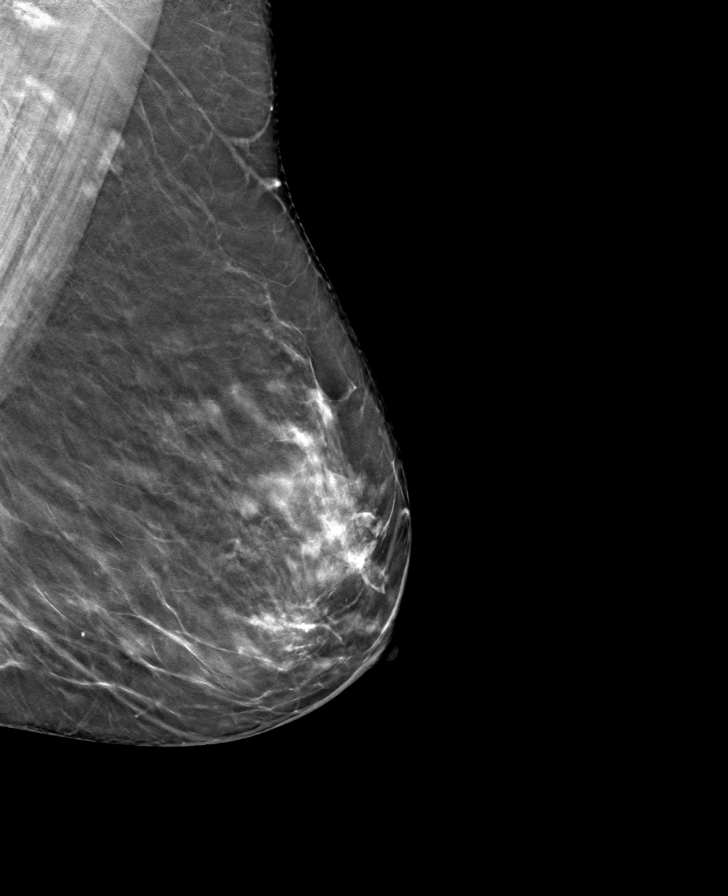

[R CC tomo · tomo slice 33/66.0]
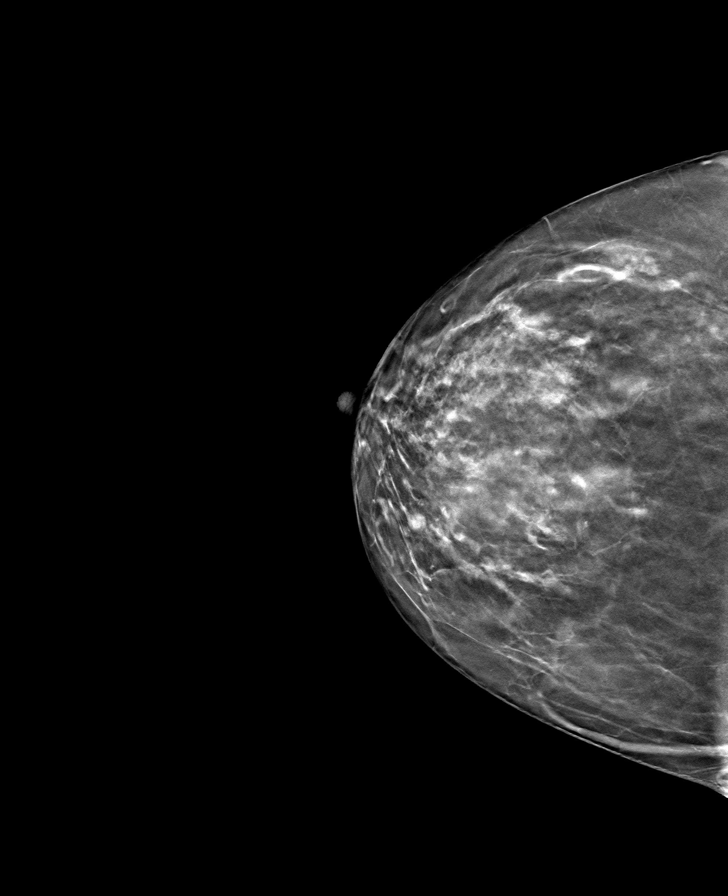

[L CC tomo · tomo slice 35/68.0]
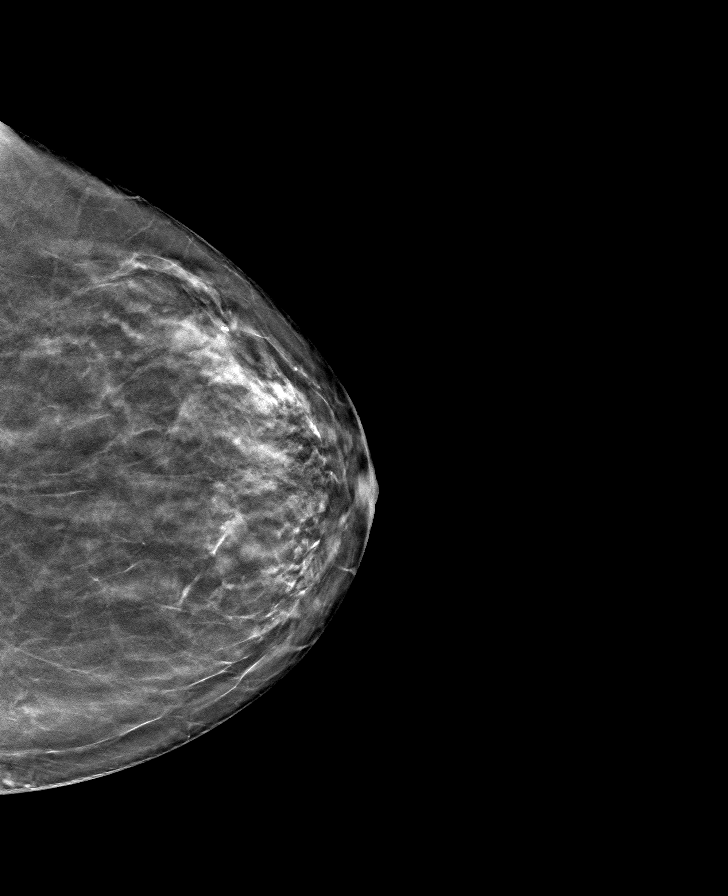

[R MLO tomo · tomo slice 35/70.0]
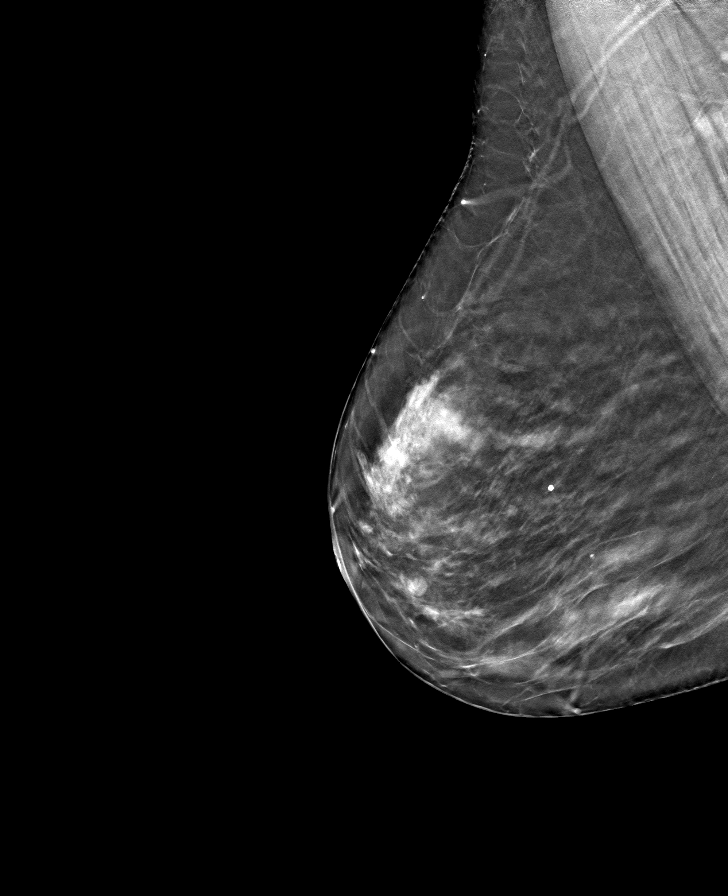

[8 of 24 positions shown; findings below may reference images not displayed]

ACR Breast Density Category b: There are scattered areas of
fibroglandular density.
FINDINGS: There are no findings suspicious for malignancy.
IMPRESSION: No mammographic evidence of malignancy. A result letter of this
screening mammogram will be mailed directly to the patient.

RECOMMENDATION:
Screening mammogram in one year. (Code:51-O-LD2)

BI-RADS CATEGORY  1: Negative.

## 2022-10-09 ENCOUNTER — Other Ambulatory Visit: Payer: Self-pay | Admitting: Internal Medicine

## 2023-01-07 ENCOUNTER — Other Ambulatory Visit: Payer: Self-pay | Admitting: Internal Medicine

## 2023-01-09 ENCOUNTER — Ambulatory Visit (INDEPENDENT_AMBULATORY_CARE_PROVIDER_SITE_OTHER): Payer: BC Managed Care – PPO | Admitting: Internal Medicine

## 2023-01-09 ENCOUNTER — Encounter: Payer: Self-pay | Admitting: Internal Medicine

## 2023-01-09 VITALS — BP 142/84 | HR 72 | Temp 97.8°F | Ht 67.0 in | Wt 163.4 lb

## 2023-01-09 DIAGNOSIS — R195 Other fecal abnormalities: Secondary | ICD-10-CM

## 2023-01-09 DIAGNOSIS — N952 Postmenopausal atrophic vaginitis: Secondary | ICD-10-CM

## 2023-01-09 DIAGNOSIS — N1831 Chronic kidney disease, stage 3a: Secondary | ICD-10-CM

## 2023-01-09 DIAGNOSIS — E785 Hyperlipidemia, unspecified: Secondary | ICD-10-CM | POA: Diagnosis not present

## 2023-01-09 DIAGNOSIS — Z0001 Encounter for general adult medical examination with abnormal findings: Secondary | ICD-10-CM | POA: Diagnosis not present

## 2023-01-09 DIAGNOSIS — R5383 Other fatigue: Secondary | ICD-10-CM

## 2023-01-09 DIAGNOSIS — D225 Melanocytic nevi of trunk: Secondary | ICD-10-CM | POA: Insufficient documentation

## 2023-01-09 DIAGNOSIS — Z1231 Encounter for screening mammogram for malignant neoplasm of breast: Secondary | ICD-10-CM

## 2023-01-09 DIAGNOSIS — R7301 Impaired fasting glucose: Secondary | ICD-10-CM | POA: Diagnosis not present

## 2023-01-09 DIAGNOSIS — Z0000 Encounter for general adult medical examination without abnormal findings: Secondary | ICD-10-CM

## 2023-01-09 LAB — COMPREHENSIVE METABOLIC PANEL
ALT: 11 U/L (ref 0–35)
AST: 16 U/L (ref 0–37)
Albumin: 4.2 g/dL (ref 3.5–5.2)
Alkaline Phosphatase: 76 U/L (ref 39–117)
BUN: 12 mg/dL (ref 6–23)
CO2: 29 mEq/L (ref 19–32)
Calcium: 9.7 mg/dL (ref 8.4–10.5)
Chloride: 103 mEq/L (ref 96–112)
Creatinine, Ser: 0.97 mg/dL (ref 0.40–1.20)
GFR: 63.36 mL/min (ref >60.00)
Glucose, Bld: 90 mg/dL (ref 70–99)
Potassium: 4.3 mEq/L (ref 3.5–5.1)
Sodium: 137 mEq/L (ref 135–145)
Total Bilirubin: 0.5 mg/dL (ref 0.2–1.2)
Total Protein: 7 g/dL (ref 6.0–8.3)

## 2023-01-09 LAB — CBC WITH DIFFERENTIAL/PLATELET
Basophils Absolute: 0.1 10*3/uL (ref 0.0–0.1)
Basophils Relative: 0.9 % (ref 0.0–3.0)
Eosinophils Absolute: 0.2 10*3/uL (ref 0.0–0.7)
Eosinophils Relative: 2.8 % (ref 0.0–5.0)
HCT: 40.1 % (ref 36.0–46.0)
Hemoglobin: 13.5 g/dL (ref 12.0–15.0)
Lymphocytes Relative: 27.2 % (ref 12.0–46.0)
Lymphs Abs: 1.8 10*3/uL (ref 0.7–4.0)
MCHC: 33.6 g/dL (ref 30.0–36.0)
MCV: 95.5 fl (ref 78.0–100.0)
Monocytes Absolute: 0.5 10*3/uL (ref 0.1–1.0)
Monocytes Relative: 7.4 % (ref 3.0–12.0)
Neutro Abs: 4 10*3/uL (ref 1.4–7.7)
Neutrophils Relative %: 61.7 % (ref 43.0–77.0)
Platelets: 235 10*3/uL (ref 150.0–400.0)
RBC: 4.2 Mil/uL (ref 3.87–5.11)
RDW: 13.7 % (ref 11.5–15.5)
WBC: 6.5 10*3/uL (ref 4.0–10.5)

## 2023-01-09 LAB — LIPID PANEL
Cholesterol: 272 mg/dL — ABNORMAL HIGH (ref 0–200)
HDL: 50.7 mg/dL (ref >39.00)
LDL Cholesterol: 197 mg/dL — ABNORMAL HIGH (ref 0–99)
NonHDL: 220.86
Total CHOL/HDL Ratio: 5
Triglycerides: 119 mg/dL (ref 0.0–149.0)
VLDL: 23.8 mg/dL (ref 0.0–40.0)

## 2023-01-09 LAB — MICROALBUMIN / CREATININE URINE RATIO
Creatinine,U: 112.9 mg/dL
Microalb Creat Ratio: 0.7 mg/g (ref 0.0–30.0)
Microalb, Ur: 0.8 mg/dL (ref 0.0–1.9)

## 2023-01-09 LAB — TSH: TSH: 1.39 u[IU]/mL (ref 0.35–5.50)

## 2023-01-09 LAB — LDL CHOLESTEROL, DIRECT: Direct LDL: 183 mg/dL

## 2023-01-09 LAB — HEMOGLOBIN A1C: Hgb A1c MFr Bld: 5.9 % (ref 4.6–6.5)

## 2023-01-09 MED ORDER — ACYCLOVIR 800 MG PO TABS: 800.0000 mg | ORAL_TABLET | Freq: Every day | ORAL | 1 refills | Status: DC

## 2023-01-09 MED ORDER — ESCITALOPRAM OXALATE 10 MG PO TABS: 10.0000 mg | ORAL_TABLET | Freq: Every day | ORAL | 1 refills | Status: DC

## 2023-01-09 MED ORDER — MOMETASONE FUROATE 50 MCG/ACT NA SUSP: 2.0000 | Freq: Every day | NASAL | 12 refills | Status: DC

## 2023-01-09 MED ORDER — TIZANIDINE HCL 4 MG PO TABS: ORAL_TABLET | ORAL | 5 refills | Status: DC

## 2023-01-09 NOTE — Progress Notes (Signed)
Patient ID: MICHALENE CUSSON, female    DOB: [birth year]  Age: 62 y.o. MRN: [id number]  The patient is here for annual preventive examination and management of other chronic and acute problems.   The risk factors are reflected in the social history.   The roster of all physicians providing medical care to patient - is listed in the Snapshot section of the chart.   Activities of daily living:  The patient is 100% independent in all ADLs: dressing, toileting, feeding as well as independent mobility   Home safety : The patient has smoke detectors in the home. They wear seatbelts.  There are no unsecured firearms at home. There is no violence in the home.    There is no risks for hepatitis, STDs or HIV. There is no   history of blood transfusion. They have no travel history to infectious disease endemic areas of the world.   The patient has seen their dentist in the last six month. They have seen their eye doctor in the last year. The patinet  denies slight hearing difficulty with regard to whispered voices and some television programs.  They have deferred audiologic testing in the last year.  They do not  have excessive sun exposure. Discussed the need for sun protection: hats, long sleeves and use of sunscreen if there is significant sun exposure.    Diet: the importance of a healthy diet is discussed. They do have a healthy diet.   The benefits of regular aerobic exercise were discussed. The patient  exercises  3 to 5 days per week  for  60 minutes.    Depression screen: there are no signs or vegative symptoms of depression- irritability, change in appetite, anhedonia, sadness/tearfullness.   The following portions of the patient's history were reviewed and updated as appropriate: allergies, current medications, past family history, past medical history,  past surgical history, past social history  and problem list.   Visual acuity was not assessed per patient preference since the patient has regular  follow up with an  ophthalmologist. Hearing and body mass index were assessed and reviewed.    During the course of the visit the patient was educated and counseled about appropriate screening and preventive services including : fall prevention , diabetes screening, nutrition counseling, colorectal cancer screening, and recommended immunizations.    Chief Complaint:  No issues     Review of Symptoms  Patient denies headache, fevers, malaise, unintentional weight loss, skin rash, eye pain, sinus congestion and sinus pain, sore throat, dysphagia,  hemoptysis , cough, dyspnea, wheezing, chest pain, palpitations, orthopnea, edema, abdominal pain, nausea, melena, diarrhea, constipation, flank pain, dysuria, hematuria, urinary  Frequency, nocturia, numbness, tingling, seizures,  Focal weakness, Loss of consciousness,  Tremor, insomnia, depression, anxiety, and suicidal ideation.    Physical Exam:  BP (!) 142/84   Pulse 72   Temp 97.8 F (36.6 C) (Oral)   Ht 5' 7"$  (1.702 m)   Wt 163 lb 6.4 oz (74.1 kg)   SpO2 95%   BMI 25.59 kg/m    Physical Exam Vitals reviewed.  Constitutional:      General: She is not in acute distress.    Appearance: Normal appearance. She is well-developed and normal weight. She is not ill-appearing, toxic-appearing or diaphoretic.  HENT:     Head: Normocephalic.     Right Ear: Tympanic membrane, ear canal and external ear normal. There is no impacted cerumen.     Left Ear: Tympanic membrane, ear canal and  external ear normal. There is no impacted cerumen.     Nose: Nose normal.     Mouth/Throat:     Mouth: Mucous membranes are moist.     Pharynx: Oropharynx is clear.  Eyes:     General: No scleral icterus.       Right eye: No discharge.        Left eye: No discharge.     Conjunctiva/sclera: Conjunctivae normal.     Pupils: Pupils are equal, round, and reactive to light.  Neck:     Thyroid: No thyromegaly.     Vascular: No carotid bruit or JVD.   Cardiovascular:     Rate and Rhythm: Normal rate and regular rhythm.     Heart sounds: Normal heart sounds.  Pulmonary:     Effort: Pulmonary effort is normal. No respiratory distress.     Breath sounds: Normal breath sounds.  Chest:  Breasts:    Breasts are symmetrical.     Right: Normal. No swelling, inverted nipple, mass, nipple discharge, skin change or tenderness.     Left: Normal. No swelling, inverted nipple, mass, nipple discharge, skin change or tenderness.  Abdominal:     General: Bowel sounds are normal.     Palpations: Abdomen is soft. There is no mass.     Tenderness: There is no abdominal tenderness. There is no guarding or rebound.  Musculoskeletal:        General: Normal range of motion.     Cervical back: Normal range of motion and neck supple.  Lymphadenopathy:     Cervical: No cervical adenopathy.     Upper Body:     Right upper body: No supraclavicular, axillary or pectoral adenopathy.     Left upper body: No supraclavicular, axillary or pectoral adenopathy.  Skin:    General: Skin is warm and dry.  Neurological:     General: No focal deficit present.     Mental Status: She is alert and oriented to person, place, and time. Mental status is at baseline.  Psychiatric:        Mood and Affect: Mood normal.        Behavior: Behavior normal.        Thought Content: Thought content normal.        Judgment: Judgment normal.     Assessment and Plan: Encounter for screening mammogram for malignant neoplasm of breast -     3D Screening Mammogram, Left and Right; Future  Encounter for preventive health examination Assessment & Plan: age appropriate education and counseling updated, referrals for preventative services and immunizations addressed, dietary and smoking counseling addressed, most recent labs reviewed.  I have personally reviewed and have noted:   1) the patient's medical and social history 2) The pt's use of alcohol, tobacco, and illicit drugs 3)  The patient's current medications and supplements 4) Functional ability including ADL's, fall risk, home safety risk, hearing and visual impairment 5) Diet and physical activities 6) Evidence for depression or mood disorder 7) The patient's height, weight, and BMI have been recorded in the chart  I have made referrals, and provided counseling and education based on review of the above    Fatigue, unspecified type -     CBC with Differential/Platelet -     TSH  Dyslipidemia -     Lipid panel -     LDL cholesterol, direct  Impaired fasting glucose -     Hemoglobin A1c -     Comprehensive metabolic panel -  Microalbumin / creatinine urine ratio  Stage 3a chronic kidney disease ([place]) Assessment & Plan: Secondary to NSAID use.  She has  suspended ibuprofen use   Use tylenol only.   Urinary protein and anemia screen are overdue Lab Results  Component Value Date   CREATININE 1.11 11/06/2021   Lab Results  Component Value Date   NA 141 11/06/2021   K 4.2 11/06/2021   CL 104 11/06/2021   CO2 28 11/06/2021   Lab Results  Component Value Date   MICROALBUR <0.2 01/27/2021     Lab Results  Component Value Date   WBC 6.2 11/06/2021   HGB 13.9 11/06/2021   HCT 42.4 11/06/2021   MCV 95.8 11/06/2021   PLT 214.0 11/06/2021  '   Atypical nevus of back Assessment & Plan: Several jet black moles on lower back and across the upper back. Referring to derm for evaluation   Orders: -     Ambulatory referral to Dermatology  Positive colorectal cancer screening using Cologuard test Assessment & Plan: Colonoscopy has been cancelled twice due to patient illness .  Reminded patient to reschedule   Postmenopausal atrophic vaginitis Assessment & Plan: Chronic, managed with Replens due to cost of premarin.  Given her tobacco cessation abd prior TAH.  Will offer estradiol     Other orders -     tiZANidine HCl; TAKE ONE TABLET BY MOUTH EVERY 6 HOURS AS NEEDED FOR MUSCLE SPASMS   Dispense: 30 tablet; Refill: 5 -     Mometasone Furoate; Place 2 sprays into the nose daily.  Dispense: 17 g; Refill: 12 -     Acyclovir; Take 1 tablet (800 mg total) by mouth daily.  Dispense: 90 tablet; Refill: 1 -     Escitalopram Oxalate; Take 1 tablet (10 mg total) by mouth daily.  Dispense: 90 tablet; Refill: 1    No follow-ups on file.  Crecencio Mc, MD

## 2023-01-09 NOTE — Patient Instructions (Addendum)
Please schedule your colonoscopy   ASAP.Marland Kitchen  an abnormal Cologuard makes it  a PRIORITY  Your annual mammogram has been ordered AND IS DUE in [place].  [facility] will not allow [place] to schedule it for you,  so please  call to make your appointment [phone]    I HAVE MADE A REFERRAL TO DR [place]    The new goals for optimal blood pressure management are 120/70. TO 130/80  Please check your blood pressure a few times at home and send me the readings so I can determine if you need TO START medication

## 2023-01-09 NOTE — Assessment & Plan Note (Signed)
Several jet black moles on lower back and across the upper back. Referring to derm for evaluation

## 2023-01-09 NOTE — Assessment & Plan Note (Signed)
Secondary to NSAID use.  She has  suspended ibuprofen use   Use tylenol only.   Urinary protein and anemia screen are overdue Lab Results  Component Value Date   CREATININE 1.11 11/06/2021   Lab Results  Component Value Date   NA 141 11/06/2021   K 4.2 11/06/2021   CL 104 11/06/2021   CO2 28 11/06/2021   Lab Results  Component Value Date   MICROALBUR <0.2 01/27/2021     Lab Results  Component Value Date   WBC 6.2 11/06/2021   HGB 13.9 11/06/2021   HCT 42.4 11/06/2021   MCV 95.8 11/06/2021   PLT 214.0 11/06/2021  '

## 2023-01-09 NOTE — Assessment & Plan Note (Signed)

## 2023-01-09 NOTE — Assessment & Plan Note (Signed)
Colonoscopy has been cancelled twice due to patient illness .  Reminded patient to reschedule

## 2023-01-11 NOTE — Assessment & Plan Note (Signed)
Chronic, managed with Replens due to cost of premarin.  Given her tobacco cessation abd prior TAH.  Will offer estradiol

## 2023-03-18 DIAGNOSIS — D485 Neoplasm of uncertain behavior of skin: Secondary | ICD-10-CM | POA: Diagnosis not present

## 2023-03-18 DIAGNOSIS — L218 Other seborrheic dermatitis: Secondary | ICD-10-CM | POA: Diagnosis not present

## 2023-03-18 DIAGNOSIS — D225 Melanocytic nevi of trunk: Secondary | ICD-10-CM | POA: Diagnosis not present

## 2023-03-18 DIAGNOSIS — L814 Other melanin hyperpigmentation: Secondary | ICD-10-CM | POA: Diagnosis not present

## 2023-05-10 ENCOUNTER — Ambulatory Visit
Admission: RE | Admit: 2023-05-10 | Discharge: 2023-05-10 | Disposition: A | Discharge status: Home / Self Care | Payer: BC Managed Care – PPO | Source: Ambulatory Visit | Attending: Internal Medicine | Admitting: Internal Medicine

## 2023-05-10 DIAGNOSIS — Z1231 Encounter for screening mammogram for malignant neoplasm of breast: Secondary | ICD-10-CM | POA: Insufficient documentation

## 2023-07-25 ENCOUNTER — Other Ambulatory Visit: Payer: Self-pay | Admitting: Internal Medicine

## 2023-09-09 ENCOUNTER — Telehealth: Payer: BC Managed Care – PPO | Admitting: Internal Medicine

## 2023-09-09 ENCOUNTER — Encounter: Payer: Self-pay | Admitting: Internal Medicine

## 2023-09-09 VITALS — Temp 100.2°F | Ht 67.0 in | Wt 163.4 lb

## 2023-09-09 DIAGNOSIS — R519 Headache, unspecified: Secondary | ICD-10-CM | POA: Diagnosis not present

## 2023-09-09 DIAGNOSIS — R509 Fever, unspecified: Secondary | ICD-10-CM | POA: Diagnosis not present

## 2023-09-09 MED ORDER — FLUCONAZOLE 150 MG PO TABS: 150.0000 mg | ORAL_TABLET | Freq: Every day | ORAL | 0 refills | Status: DC

## 2023-09-09 MED ORDER — DOXYCYCLINE HYCLATE 100 MG PO TABS: 100.0000 mg | ORAL_TABLET | Freq: Two times a day (BID) | ORAL | 0 refills | Status: DC

## 2023-09-09 MED ORDER — ONDANSETRON 4 MG PO TBDP: 4.0000 mg | ORAL_TABLET | Freq: Three times a day (TID) | ORAL | 0 refills | Status: DC | PRN

## 2023-09-09 NOTE — Progress Notes (Signed)
Virtual Visit via Caregility   Note   This format is felt to be most appropriate for this patient at this time.  All issues noted in this document were discussed and addressed.  No physical exam was performed (except for noted visual exam findings with Video Visits).   I connected with Ashley [name] on 09/09/23 at  4:00 PM EDT by a video enabled telemedicine application and verified that I am speaking with the correct person using two identifiers. Location patient: home Location provider: work or home office Persons participating in the virtual visit: patient, provider  I discussed the limitations, risks, security and privacy concerns of performing an evaluation and management service by telephone and the availability of in person appointments. I also discussed with the patient that there may be a patient responsible charge related to this service. The patient expressed understanding and agreed to proceed. Reason for visit: fevers,  headache  HPI:  62 yr old female with no significant PMH presents with recurrent fevers to 101.5 for the past 3 days accompanied by headache and body aches.  She denies sinus symptoms and cough  but states that she has had some very mild discomfort  over her left maxillary sinus without congestion or purulent discharge and noted some dry nasal passages that bled when she blew her nose. She has no history of recent travel and no sick contacts.    ROS: See pertinent positives and negatives per HPI.  Past Medical History:  Diagnosis Date   Allergy    managed with saline lavage   Benign neoplasm of breast 2011   Bursitis 2011   History of migraine headaches    Plantar fasciitis of left foot    Pleurisy Jan 2013   treated with lorazepam and ibuprofen    Past Surgical History:  Procedure Laterality Date   ABDOMINAL HYSTERECTOMY  2006   for precancerous cervix   BREAST BIOPSY Bilateral 2010,2011   neg   ELBOW SURGERY  2005    Family History  Problem Relation  Age of Onset   Brain cancer Mother 61       metastic lung CA (presumed)   Heart disease Father        complications of paralysis   Cancer Maternal Grandmother 41       breast cancer   Breast cancer Maternal Grandmother 74   Heart disease Sister        dilated cardiomyopathy    SOCIAL HX:  reports that she quit smoking about 11 years ago. Her smoking use included cigarettes. She has never used smokeless tobacco. She reports current alcohol use. She reports that she does not use drugs.    Current Outpatient Medications:    acyclovir (ZOVIRAX) 800 MG tablet, Take 1 tablet (800 mg total) by mouth daily., Disp: 90 tablet, Rfl: 1   busPIRone (BUSPAR) 10 MG tablet, TAKE 1 TABLET BY MOUTH THREE TIMES DAILY, Disp: 270 tablet, Rfl: 1   doxycycline (VIBRA-TABS) 100 MG tablet, Take 1 tablet (100 mg total) by mouth 2 (two) times daily., Disp: 14 tablet, Rfl: 0   escitalopram (LEXAPRO) 10 MG tablet, TAKE 1 TABLET BY MOUTH DAILY, Disp: 90 tablet, Rfl: 0   fexofenadine (ALLEGRA) 180 MG tablet, Take 1 tablet (180 mg total) by mouth daily., Disp: 90 tablet, Rfl: 1   fluconazole (DIFLUCAN) 150 MG tablet, Take 1 tablet (150 mg total) by mouth daily., Disp: 2 tablet, Rfl: 0   mometasone (NASONEX) 50 MCG/ACT nasal spray, Place 2 sprays into  the nose daily., Disp: 17 g, Rfl: 12   ondansetron (ZOFRAN-ODT) 4 MG disintegrating tablet, Take 1 tablet (4 mg total) by mouth every 8 (eight) hours as needed for nausea or vomiting., Disp: 20 tablet, Rfl: 0   tiZANidine (ZANAFLEX) 4 MG tablet, TAKE ONE TABLET BY MOUTH EVERY 6 HOURS AS NEEDED FOR MUSCLE SPASMS, Disp: 30 tablet, Rfl: 5   cholecalciferol (VITAMIN D3) 25 MCG (1000 UT) tablet, Take 1,000 Units by mouth daily. (Patient not taking: Reported on 09/09/2023), Disp: , Rfl:    Turmeric 400 MG CAPS, Take 1 capsule by mouth daily. (Patient not taking: Reported on 09/09/2023), Disp: , Rfl:    vitamin C (ASCORBIC ACID) 500 MG tablet, Take 500 mg by mouth daily. (Patient  not taking: Reported on 09/09/2023), Disp: , Rfl:   EXAM:  VITALS per patient if applicable:  GENERAL: alert, oriented, appears well and in no acute distress  HEENT: atraumatic, conjunttiva clear, no obvious abnormalities on inspection of external nose and ears  NECK: normal movements of the head and neck  LUNGS: on inspection no signs of respiratory distress, breathing rate appears normal, no obvious gross SOB, gasping or wheezing  CV: no obvious cyanosis  MS: moves all visible extremities without noticeable abnormality  PSYCH/NEURO: pleasant and cooperative, no obvious depression or anxiety, speech and thought processing grossly intact  ASSESSMENT AND PLAN: Febrile illness Assessment & Plan: Headache, body aches and recurrent fevers for the last 72 hours.  COVID negative at [facility].  .empiric doxycycline ordered.  Follow up in 1-2 weeks.  Check antibody titers to tick borne illnesses.    Orders: -     Rocky mtn spotted fvr abs pnl(IgG+IgM); Future -     Ehrlichia antibody panel; Future  Acute nonintractable headache, unspecified headache type -     Rocky mtn spotted fvr abs pnl(IgG+IgM); Future -     Ehrlichia antibody panel; Future  Other orders -     Doxycycline Hyclate; Take 1 tablet (100 mg total) by mouth 2 (two) times daily.  Dispense: 14 tablet; Refill: 0 -     Ondansetron; Take 1 tablet (4 mg total) by mouth every 8 (eight) hours as needed for nausea or vomiting.  Dispense: 20 tablet; Refill: 0 -     Fluconazole; Take 1 tablet (150 mg total) by mouth daily.  Dispense: 2 tablet; Refill: 0      I discussed the assessment and treatment plan with the patient. The patient was provided an opportunity to ask questions and all were answered. The patient agreed with the plan and demonstrated an understanding of the instructions.   The patient was advised to call back or seek an in-person evaluation if the symptoms worsen or if the condition fails to improve as  anticipated.   I spent 30 minutes dedicated to the care of this patient on the date of this encounter to include pre-visit review of his medical history,  Face-to-face time with the patient , and post visit ordering of testing and therapeutics.    Sherlene Shams, MD

## 2023-09-09 NOTE — Patient Instructions (Signed)
I  am treating you with an antibiotic that will cover  bacterial sinusitis and  tick fever     I am prescribing an antibiotic (doxycycline) to take twice daily with a full meal (to avoid nausea)   I have also sent in Zofran  to take in case you get nauseated, and fluconazole in case you get a yeast infection     Please take a generic probiotic ( generic form of Align, Flora que or Culturelle) OR  eat a serving of yogurt   daily for 3 weeks since you are taking an  antibiotic to prevent a very serious antibiotic associated infection  Called clostridium dificile colitis that can cause diarrhea, multi organ failure, sepsis and death if not managed.

## 2023-09-09 NOTE — Assessment & Plan Note (Signed)
Headache, body aches and recurrent fevers for the last 72 hours.  COVID negative at [facility].  .empiric doxycycline ordered.  Follow up in 1-2 weeks.  Check antibody titers to tick borne illnesses.

## 2023-09-11 ENCOUNTER — Encounter: Payer: Self-pay | Admitting: Internal Medicine

## 2023-09-11 ENCOUNTER — Telehealth: Payer: Self-pay | Admitting: Internal Medicine

## 2023-09-11 NOTE — Telephone Encounter (Signed)
Is it okay to type pt a work note?

## 2023-09-11 NOTE — Telephone Encounter (Signed)
REturn to work letter sent to patient

## 2023-10-21 ENCOUNTER — Encounter: Payer: Self-pay | Admitting: Internal Medicine

## 2023-10-21 ENCOUNTER — Telehealth: Payer: BC Managed Care – PPO | Admitting: Internal Medicine

## 2023-10-21 ENCOUNTER — Other Ambulatory Visit: Payer: Self-pay | Admitting: Internal Medicine

## 2023-10-21 VITALS — Temp 100.6°F | Ht 67.0 in | Wt 163.4 lb

## 2023-10-21 DIAGNOSIS — J40 Bronchitis, not specified as acute or chronic: Secondary | ICD-10-CM | POA: Diagnosis not present

## 2023-10-21 MED ORDER — ALBUTEROL SULFATE HFA 108 (90 BASE) MCG/ACT IN AERS: 2.0000 | INHALATION_SPRAY | Freq: Four times a day (QID) | RESPIRATORY_TRACT | 11 refills | Status: DC | PRN

## 2023-10-21 MED ORDER — HYDROCOD POLI-CHLORPHE POLI ER 10-8 MG/5ML PO SUER: 5.0000 mL | Freq: Two times a day (BID) | ORAL | 0 refills | Status: DC | PRN

## 2023-10-21 MED ORDER — FLUCONAZOLE 150 MG PO TABS: 150.0000 mg | ORAL_TABLET | Freq: Every day | ORAL | 0 refills | Status: DC

## 2023-10-21 MED ORDER — DOXYCYCLINE HYCLATE 100 MG PO TABS: 100.0000 mg | ORAL_TABLET | Freq: Two times a day (BID) | ORAL | 0 refills | Status: DC

## 2023-10-21 NOTE — Progress Notes (Unsigned)
Virtual Visit via Caregility   Note   This format is felt to be most appropriate for this patient at this time.  All issues noted in this document were discussed and addressed.  No physical exam was performed (except for noted visual exam findings with Video Visits).   I connected with Ashley Ashley [name] 10/21/23 at  3:00 PM EST by a video enabled telemedicine application and verified that I am speaking with the correct person using two identifiers. Location patient: home Location provider: work or home office Persons participating in the virtual visit: patient, provider  I discussed the limitations, risks, security and privacy concerns of performing an evaluation and management service by telephone and the availability of in person appointments. I also discussed with the patient that there may be a patient responsible charge related to this service. The patient expressed understanding and agreed to proceed.  Interactive audio and video telecommunications were attempted between this provider and patient, however failed, due to patient having technical difficulties OR patient did not have access to video capability.  We continued and completed visit with audio only. ***  Reason for visit: new onset fever,  productive cough x 3 weeks   HPI: ***   ROS: See pertinent positives and negatives per HPI.  Past Medical History:  Diagnosis Date   Allergy    managed with saline lavage   Benign neoplasm of breast 2011   Bursitis 2011   History of migraine headaches    Plantar fasciitis of left foot    Pleurisy Jan 2013   treated with lorazepam and ibuprofen    Past Surgical History:  Procedure Laterality Date   ABDOMINAL HYSTERECTOMY  2006   for precancerous cervix   BREAST BIOPSY Bilateral 2010,2011   neg   ELBOW SURGERY  2005    Family History  Problem Relation Age of Onset   Brain cancer Mother 85       metastic lung CA (presumed)   Heart disease Father        complications of paralysis    Cancer Maternal Grandmother 74       breast cancer   Breast cancer Maternal Grandmother 32   Heart disease Sister        dilated cardiomyopathy    SOCIAL HX: ***   Current Outpatient Medications:    acyclovir (ZOVIRAX) 800 MG tablet, Take 1 tablet (800 mg total) by mouth daily., Disp: 90 tablet, Rfl: 1   busPIRone (BUSPAR) 10 MG tablet, TAKE 1 TABLET BY MOUTH THREE TIMES DAILY, Disp: 270 tablet, Rfl: 1   doxycycline (VIBRA-TABS) 100 MG tablet, Take 1 tablet (100 mg total) by mouth 2 (two) times daily., Disp: 14 tablet, Rfl: 0   escitalopram (LEXAPRO) 10 MG tablet, TAKE 1 TABLET BY MOUTH DAILY, Disp: 90 tablet, Rfl: 0   fexofenadine (ALLEGRA) 180 MG tablet, Take 1 tablet (180 mg total) by mouth daily., Disp: 90 tablet, Rfl: 1   mometasone (NASONEX) 50 MCG/ACT nasal spray, Place 2 sprays into the nose daily., Disp: 17 g, Rfl: 12   ondansetron (ZOFRAN-ODT) 4 MG disintegrating tablet, Take 1 tablet (4 mg total) by mouth every 8 (eight) hours as needed for nausea or vomiting., Disp: 20 tablet, Rfl: 0   tiZANidine (ZANAFLEX) 4 MG tablet, TAKE ONE TABLET BY MOUTH EVERY 6 HOURS AS NEEDED FOR MUSCLE SPASMS, Disp: 30 tablet, Rfl: 5   Turmeric 400 MG CAPS, Take 1 capsule by mouth daily., Disp: , Rfl:    vitamin C (ASCORBIC ACID)  500 MG tablet, Take 500 mg by mouth daily., Disp: , Rfl:    cholecalciferol (VITAMIN D3) 25 MCG (1000 UT) tablet, Take 1,000 Units by mouth daily. (Patient not taking: Reported Ashley [name] 09/09/2023), Disp: , Rfl:   EXAM:  VITALS per patient if applicable:  GENERAL: alert, oriented, appears well and in no acute distress  HEENT: atraumatic, conjunttiva clear, no obvious abnormalities Ashley [name] inspection of external nose and ears  NECK: normal movements of the head and neck  LUNGS: Ashley [name] inspection no signs of respiratory distress, breathing rate appears normal, no obvious gross SOB, gasping or wheezing  CV: no obvious cyanosis  MS: moves all visible extremities without noticeable  abnormality  PSYCH/NEURO: pleasant and cooperative, no obvious depression or anxiety, speech and thought processing grossly intact  ASSESSMENT AND PLAN: There are no diagnoses linked to this encounter.    I discussed the assessment and treatment plan with the patient. The patient was provided an opportunity to ask questions and all were answered. The patient agreed with the plan and demonstrated an understanding of the instructions.   The patient was advised to call back or seek an in-person evaluation if the symptoms worsen or if the condition fails to improve as anticipated.   I spent 30 minutes dedicated to the care of this patient Ashley [name] the date of this encounter to include pre-visit review of his medical history,  Face-to-face time with the patient , and post visit ordering of testing and therapeutics.    Ashley Shams, MD

## 2023-10-22 DIAGNOSIS — J40 Bronchitis, not specified as acute or chronic: Secondary | ICD-10-CM | POA: Insufficient documentation

## 2023-10-22 NOTE — Assessment & Plan Note (Signed)
Empiric treatment with doxycycline , bronchodilator. and cough suppressant .  Will need chest x ray if symptoms do not resolve work note for today tomorrow and Wednesday written and sent to patient  via Mychart. Marland Kitchen

## 2023-11-11 ENCOUNTER — Encounter: Payer: Self-pay | Admitting: Family Medicine

## 2023-11-11 ENCOUNTER — Telehealth: Payer: BC Managed Care – PPO | Admitting: Family Medicine

## 2023-11-11 VITALS — Temp 100.0°F | Ht 67.0 in | Wt 163.0 lb

## 2023-11-11 DIAGNOSIS — J329 Chronic sinusitis, unspecified: Secondary | ICD-10-CM

## 2023-11-11 DIAGNOSIS — J40 Bronchitis, not specified as acute or chronic: Secondary | ICD-10-CM | POA: Diagnosis not present

## 2023-11-11 DIAGNOSIS — R509 Fever, unspecified: Secondary | ICD-10-CM

## 2023-11-11 MED ORDER — HYDROCOD POLI-CHLORPHE POLI ER 10-8 MG/5ML PO SUER: 5.0000 mL | Freq: Two times a day (BID) | ORAL | 0 refills | Status: DC | PRN

## 2023-11-11 MED ORDER — AZITHROMYCIN 250 MG PO TABS: ORAL_TABLET | ORAL | 0 refills | Status: AC

## 2023-11-11 NOTE — Patient Instructions (Addendum)
It was a pleasure meeting you today. Thank you for allowing me to take part in your health care.  Our goals for today as we discussed include:  Start Zithromax for 5 days Tussenex sent  Recommend fever work up.  Follow up with PCP as soon as possible.   If you have any questions or concerns, please do not hesitate to call the office at [phone].  I look forward to our next visit and until then take care and stay safe.  Regards,   Dana Allan, MD   [facility]

## 2023-11-11 NOTE — Progress Notes (Signed)
Virtual Visit via Video note  I connected with Ashley [name] on 11/19/23 at 1540 by video and verified that I am speaking with the correct person using two identifiers. Ashley [name] is currently located at home and  is currently alone during visit. The provider, Dana Allan, MD is located in their office at time of visit.  I discussed the limitations, risks, security and privacy concerns of performing an evaluation and management service by video and the availability of in person appointments. I also discussed with the patient that there may be a patient responsible charge related to this service. The patient expressed understanding and agreed to proceed.  Subjective: PCP: Sherlene Shams, MD  Chief Complaint  Patient presents with   Fever    Started on Saturday 103 to 100   Cough    Has not taken covid    Generalized Body Aches   Ear Pain    HPI Discussed the use of AI scribe software for clinical note transcription with the patient, who gave verbal consent to proceed.  History of Present Illness The patient, with a history of chronic respiratory issues, presents with a persistent fever since Saturday, peaking at 103.14F. She reports a persistent cough, which is particularly severe in the mornings, likely due to postnasal drainage. The coughing episodes are so severe that they interfere with the patient's ability to work. The patient also reports significant nasal congestion and postnasal drainage, which she believes is contributing to her cough.  Approximately two and a half weeks prior, the patient was treated with doxycycline for similar symptoms. However, she reports never feeling fully recovered since completing the course of antibiotics. The patient also mentions a history of being treated for [facility] spotted fever in October.  The patient has been unable to attend in-person appointments due to her illness and has been managing her symptoms at home. She has been using an  inhaler as needed for wheezing, but reports minimal relief. The patient also reports the development of fever blisters since the onset of the fever.  The patient has a known allergy to amoxicillin. Current medications include Ceglovir, Ventolin, bupropion, chlorphetamine, Zenatane, Zanaflex, and turmeric. She also takes Desenex at night to help with sleep.  The patient lives alone and has been managing her symptoms independently. She expresses concern about her ability to drive due to her current illness and has been avoiding contact with others due to her fever.   ROS: Per HPI  Current Outpatient Medications:    acyclovir (ZOVIRAX) 800 MG tablet, Take 1 tablet (800 mg total) by mouth daily., Disp: 90 tablet, Rfl: 1   albuterol (VENTOLIN HFA) 108 (90 Base) MCG/ACT inhaler, Inhale 2 puffs into the lungs every 6 (six) hours as needed for wheezing or shortness of breath., Disp: 3.7 g, Rfl: 11   busPIRone (BUSPAR) 10 MG tablet, TAKE 1 TABLET BY MOUTH THREE TIMES DAILY, Disp: 270 tablet, Rfl: 1   cholecalciferol (VITAMIN D3) 25 MCG (1000 UT) tablet, Take 1,000 Units by mouth daily., Disp: , Rfl:    escitalopram (LEXAPRO) 10 MG tablet, TAKE 1 TABLET BY MOUTH DAILY, Disp: 90 tablet, Rfl: 0   fexofenadine (ALLEGRA) 180 MG tablet, Take 1 tablet (180 mg total) by mouth daily., Disp: 90 tablet, Rfl: 1   mometasone (NASONEX) 50 MCG/ACT nasal spray, Place 2 sprays into the nose daily., Disp: 17 g, Rfl: 12   ondansetron (ZOFRAN-ODT) 4 MG disintegrating tablet, Take 1 tablet (4 mg total) by mouth  every 8 (eight) hours as needed for nausea or vomiting., Disp: 20 tablet, Rfl: 0   tiZANidine (ZANAFLEX) 4 MG tablet, TAKE ONE TABLET BY MOUTH EVERY 6 HOURS AS NEEDED FOR MUSCLE SPASMS, Disp: 30 tablet, Rfl: 5   Turmeric 400 MG CAPS, Take 1 capsule by mouth daily., Disp: , Rfl:    vitamin C (ASCORBIC ACID) 500 MG tablet, Take 500 mg by mouth daily., Disp: , Rfl:    chlorpheniramine-HYDROcodone (TUSSIONEX) 10-8 MG/5ML,  Take 5 mLs by mouth every 12 (twelve) hours as needed for cough., Disp: 115 mL, Rfl: 0  Observations/Objective: Physical Exam Pulmonary:     Effort: Pulmonary effort is normal.  Neurological:     Mental Status: She is alert and oriented to person, place, and time. Mental status is at baseline.  Psychiatric:        Mood and Affect: Mood normal.        Behavior: Behavior normal.        Thought Content: Thought content normal.        Judgment: Judgment normal.    Assessment and Plan: Chronic sinusitis, unspecified location Assessment & Plan: Fever up to 103.6 since Saturday with sinus pressure and postnasal drainage. Recent history of doxycycline treatment for suspected [facility] Spotted Fever. Concern for possible pneumonia given fever and cough, but unable to assess without physical examination and further diagnostic testing. -Prescribe Zithromax (Z-Pak) for suspected sinusitis. -Recommend follow-up appointment for physical examination, chest x-ray, and blood work as soon as possible.  Orders: -     Azithromycin; Take 2 tablets on day 1, then 1 tablet daily on days 2 through 5  Dispense: 6 tablet; Refill: 0  Febrile illness Assessment & Plan: Recent antibiotic completed.  Continued fevers -Recommend follow-up appointment for physical examination, chest x-ray, and blood work as soon as possible.Declined office visit, ED evaluation -Zpack initiated.   -Strict return precautions provided   Orders: -     Hydrocod Poli-Chlorphe Poli ER; Take 5 mLs by mouth every 12 (twelve) hours as needed for cough.  Dispense: 115 mL; Refill: 0  Bronchitis Assessment & Plan: Morning cough likely secondary to postnasal drainage. Patient requesting refill of Tussionex for cough.  Improvement noted since recent illness. -Continue use of inhaler as needed. -Refill Tussionex for symptomatic relief until follow-up with PCP     Follow Up Instructions: Return for PCP.   I discussed the  assessment and treatment plan with the patient. The patient was provided an opportunity to ask questions and all were answered. The patient agreed with the plan and demonstrated an understanding of the instructions.   The patient was advised to call back or seek an in-person evaluation if the symptoms worsen or if the condition fails to improve as anticipated.  The above assessment and management plan was discussed with the patient. The patient verbalized understanding of and has agreed to the management plan. Patient is aware to call the clinic if symptoms persist or worsen. Patient is aware when to return to the clinic for a follow-up visit. Patient educated on when it is appropriate to go to the emergency department.     Dana Allan, MD

## 2023-11-19 ENCOUNTER — Encounter: Payer: Self-pay | Admitting: Family Medicine

## 2023-11-19 DIAGNOSIS — J329 Chronic sinusitis, unspecified: Secondary | ICD-10-CM | POA: Insufficient documentation

## 2023-11-19 NOTE — Assessment & Plan Note (Signed)
Morning cough likely secondary to postnasal drainage. Patient requesting refill of Tussionex for cough.  Improvement noted since recent illness. -Continue use of inhaler as needed. -Refill Tussionex for symptomatic relief until follow-up with PCP

## 2023-11-19 NOTE — Assessment & Plan Note (Signed)
Fever up to 103.6 since Saturday with sinus pressure and postnasal drainage. Recent history of doxycycline treatment for suspected [facility] Spotted Fever. Concern for possible pneumonia given fever and cough, but unable to assess without physical examination and further diagnostic testing. -Prescribe Zithromax (Z-Pak) for suspected sinusitis. -Recommend follow-up appointment for physical examination, chest x-ray, and blood work as soon as possible.

## 2023-11-19 NOTE — Assessment & Plan Note (Addendum)
Recent antibiotic completed.  Continued fevers -Recommend follow-up appointment for physical examination, chest x-ray, and blood work as soon as possible.Declined office visit, ED evaluation -Zpack initiated.   -Strict return precautions provided

## 2023-12-03 ENCOUNTER — Telehealth: Payer: BC Managed Care – PPO | Admitting: Internal Medicine

## 2024-01-24 ENCOUNTER — Other Ambulatory Visit: Payer: Self-pay | Admitting: Internal Medicine

## 2024-04-21 ENCOUNTER — Other Ambulatory Visit: Payer: Self-pay | Admitting: Internal Medicine

## 2024-04-21 NOTE — Telephone Encounter (Signed)
 CRM reported (pt should have been scheduled for an appt)

## 2024-04-21 NOTE — Telephone Encounter (Signed)
 Copied from CRM [id number]. Topic: General - Call Back - No Documentation >> Apr 21, 2024 11:14 AM Caliyah H wrote: Reason for CRM: Patient called back in response to a missed call from the clinic.

## 2024-04-21 NOTE — Telephone Encounter (Signed)
 LMTCB. Pt is overdue for an appointment. Last appointment was in 12/2022.

## 2024-06-15 ENCOUNTER — Encounter: Payer: Self-pay | Admitting: Internal Medicine

## 2024-06-15 DIAGNOSIS — R7301 Impaired fasting glucose: Secondary | ICD-10-CM

## 2024-06-15 DIAGNOSIS — R5383 Other fatigue: Secondary | ICD-10-CM

## 2024-06-15 DIAGNOSIS — E785 Hyperlipidemia, unspecified: Secondary | ICD-10-CM

## 2024-06-15 NOTE — Telephone Encounter (Signed)
 I have pended labs for your approval. If needed before next appt let me know and I will get pt scheduled for a lab appt.

## 2024-06-22 ENCOUNTER — Ambulatory Visit (INDEPENDENT_AMBULATORY_CARE_PROVIDER_SITE_OTHER): Admitting: Internal Medicine

## 2024-06-22 ENCOUNTER — Other Ambulatory Visit (INDEPENDENT_AMBULATORY_CARE_PROVIDER_SITE_OTHER)

## 2024-06-22 ENCOUNTER — Encounter: Payer: Self-pay | Admitting: Internal Medicine

## 2024-06-22 VITALS — BP 96/64 | HR 89 | Ht 67.0 in | Wt 161.6 lb

## 2024-06-22 DIAGNOSIS — R7301 Impaired fasting glucose: Secondary | ICD-10-CM | POA: Diagnosis not present

## 2024-06-22 DIAGNOSIS — R195 Other fecal abnormalities: Secondary | ICD-10-CM

## 2024-06-22 DIAGNOSIS — R5383 Other fatigue: Secondary | ICD-10-CM | POA: Diagnosis not present

## 2024-06-22 DIAGNOSIS — E663 Overweight: Secondary | ICD-10-CM

## 2024-06-22 DIAGNOSIS — E785 Hyperlipidemia, unspecified: Secondary | ICD-10-CM

## 2024-06-22 DIAGNOSIS — D499 Neoplasm of unspecified behavior of unspecified site: Secondary | ICD-10-CM

## 2024-06-22 DIAGNOSIS — Z0000 Encounter for general adult medical examination without abnormal findings: Secondary | ICD-10-CM | POA: Diagnosis not present

## 2024-06-22 DIAGNOSIS — N1831 Chronic kidney disease, stage 3a: Secondary | ICD-10-CM | POA: Diagnosis not present

## 2024-06-22 DIAGNOSIS — R198 Other specified symptoms and signs involving the digestive system and abdomen: Secondary | ICD-10-CM

## 2024-06-22 DIAGNOSIS — Z716 Tobacco abuse counseling: Secondary | ICD-10-CM | POA: Diagnosis not present

## 2024-06-22 LAB — LIPID PANEL
Cholesterol: 265 mg/dL — ABNORMAL HIGH (ref 0–200)
HDL: 60 mg/dL (ref >39.00)
LDL Cholesterol: 188 mg/dL — ABNORMAL HIGH (ref 0–99)
NonHDL: 204.55
Total CHOL/HDL Ratio: 4
Triglycerides: 83 mg/dL (ref 0.0–149.0)
VLDL: 16.6 mg/dL (ref 0.0–40.0)

## 2024-06-22 LAB — COMPREHENSIVE METABOLIC PANEL WITH GFR
ALT: 14 U/L (ref 0–35)
AST: 19 U/L (ref 0–37)
Albumin: 4.4 g/dL (ref 3.5–5.2)
Alkaline Phosphatase: 85 U/L (ref 39–117)
BUN: 21 mg/dL (ref 6–23)
CO2: 29 meq/L (ref 19–32)
Calcium: 9.7 mg/dL (ref 8.4–10.5)
Chloride: 102 meq/L (ref 96–112)
Creatinine, Ser: 1.07 mg/dL (ref 0.40–1.20)
GFR: 55.75 mL/min — ABNORMAL LOW (ref >60.00)
Glucose, Bld: 94 mg/dL (ref 70–99)
Potassium: 4.7 meq/L (ref 3.5–5.1)
Sodium: 139 meq/L (ref 135–145)
Total Bilirubin: 0.6 mg/dL (ref 0.2–1.2)
Total Protein: 6.8 g/dL (ref 6.0–8.3)

## 2024-06-22 LAB — CBC WITH DIFFERENTIAL/PLATELET
Basophils Absolute: 0 K/uL (ref 0.0–0.1)
Basophils Relative: 0.7 % (ref 0.0–3.0)
Eosinophils Absolute: 0.2 K/uL (ref 0.0–0.7)
Eosinophils Relative: 3.1 % (ref 0.0–5.0)
HCT: 42.1 % (ref 36.0–46.0)
Hemoglobin: 13.9 g/dL (ref 12.0–15.0)
Lymphocytes Relative: 32.4 % (ref 12.0–46.0)
Lymphs Abs: 1.7 K/uL (ref 0.7–4.0)
MCHC: 33 g/dL (ref 30.0–36.0)
MCV: 93.9 fl (ref 78.0–100.0)
Monocytes Absolute: 0.5 K/uL (ref 0.1–1.0)
Monocytes Relative: 9.8 % (ref 3.0–12.0)
Neutro Abs: 2.8 K/uL (ref 1.4–7.7)
Neutrophils Relative %: 54 % (ref 43.0–77.0)
Platelets: 231 K/uL (ref 150.0–400.0)
RBC: 4.48 Mil/uL (ref 3.87–5.11)
RDW: 13.8 % (ref 11.5–15.5)
WBC: 5.2 K/uL (ref 4.0–10.5)

## 2024-06-22 LAB — HEMOGLOBIN A1C: Hgb A1c MFr Bld: 6 % (ref 4.6–6.5)

## 2024-06-22 LAB — LDL CHOLESTEROL, DIRECT: Direct LDL: 176 mg/dL

## 2024-06-22 MED ORDER — FLUTICASONE PROPIONATE 50 MCG/ACT NA SUSP: 1.0000 | Freq: Every day | NASAL | 12 refills | Status: AC

## 2024-06-22 MED ORDER — TIZANIDINE HCL 4 MG PO TABS: ORAL_TABLET | ORAL | 5 refills | Status: AC

## 2024-06-22 MED ORDER — ACYCLOVIR 800 MG PO TABS: 800.0000 mg | ORAL_TABLET | Freq: Every day | ORAL | 1 refills | Status: AC

## 2024-06-22 MED ORDER — ESCITALOPRAM OXALATE 10 MG PO TABS: 10.0000 mg | ORAL_TABLET | Freq: Every day | ORAL | 1 refills | Status: DC

## 2024-06-22 NOTE — Progress Notes (Unsigned)
 Patient ID: Ashley [name], female    DOB: 09/18/1961  Age: 63 y.o. MRN: [id number]  The patient is here for annual preventive examination and management of other chronic and acute problems.   The risk factors are reflected in the social history.   The roster of all physicians providing medical care to patient - is listed in the Snapshot section of the chart.   Activities of daily living:  The patient is 100% independent in all ADLs: dressing, toileting, feeding as well as independent mobility   Home safety : The patient has smoke detectors in the home. They wear seatbelts.  There are no unsecured firearms at home. There is no violence in the home.    There is no risks for hepatitis, STDs or HIV. There is no   history of blood transfusion. They have no travel history to infectious disease endemic areas of the world.   The patient has seen their dentist in the last six month. They have seen their eye doctor in the last year. The patinet  denies slight hearing difficulty with regard to whispered voices and some television programs.  They have deferred audiologic testing in the last year.  They do not  have excessive sun exposure. Discussed the need for sun protection: hats, long sleeves and use of sunscreen if there is significant sun exposure.    Diet: the importance of a healthy diet is discussed. They do have a healthy diet.   The benefits of regular aerobic exercise were discussed. The patient  exercises  3 to 5 days per week  for  60 minutes.    Depression screen: there are no signs or vegative symptoms of depression- irritability, change in appetite, anhedonia, sadness/tearfullness.   The following portions of the patient's history were reviewed and updated as appropriate: allergies, current medications, past family history, past medical history,  past surgical history, past social history  and problem list.   Visual acuity was not assessed per patient preference since the patient has regular  follow up with an  ophthalmologist. Hearing and body mass index were assessed and reviewed.    During the course of the visit the patient was educated and counseled about appropriate screening and preventive services including : fall prevention , diabetes screening, nutrition counseling, colorectal cancer screening, and recommended immunizations.    Chief Complaint:  Tobacco abuse:  Still smoking , would like to repeat chantix  trial when  the summer's over.  Has a smoker's cackle. And frequent bronchitis but denies daily productive cough   Requesting new dermatology referral; was not happy with interactions and treatment by Dr Sheri staff:  1) was rescheduled  after arriving 1 minute late [facility]  despite calling office enroute to request help finding the entrance to the parking lot. . Had a biopsy of a lesion on the top of her left trapezius  during her rescheduled appointment,  has had shooting pains from the excision site  for weeks.    Positive cologuard in 2022:  both scheduled colonoscopies by Dr Unk  were postponed  by patient.  she  has not had follow up   Had a  slip In the tub/shower , floor was slippery from conditioner. Leg hit the tub side became bruised and swelled   Review of Symptoms  Patient denies headache, fevers, malaise, unintentional weight loss, skin rash, eye pain, sinus congestion and sinus pain, sore throat, dysphagia,  hemoptysis , cough, dyspnea, wheezing, chest pain, palpitations, orthopnea, edema, abdominal pain,  nausea, melena, diarrhea, constipation, flank pain, dysuria, hematuria, urinary  Frequency, nocturia, numbness, tingling, seizures,  Focal weakness, Loss of consciousness,  Tremor, insomnia, depression, anxiety, and suicidal ideation.    Physical Exam:  BP 96/64   Pulse 89   Ht 5' 7 (1.702 m)   Wt 161 lb 9.6 oz (73.3 kg)   SpO2 96%   BMI 25.31 kg/m    Physical Exam Vitals reviewed.  Constitutional:      General: She is not in acute  distress.    Appearance: Normal appearance. She is well-developed and normal weight. She is not ill-appearing, toxic-appearing or diaphoretic.  HENT:     Head: Normocephalic.     Right Ear: Tympanic membrane, ear canal and external ear normal. There is no impacted cerumen.     Left Ear: Tympanic membrane, ear canal and external ear normal. There is no impacted cerumen.     Nose: Nose normal.     Mouth/Throat:     Mouth: Mucous membranes are moist.     Pharynx: Oropharynx is clear.  Eyes:     General: No scleral icterus.       Right eye: No discharge.        Left eye: No discharge.     Conjunctiva/sclera: Conjunctivae normal.     Pupils: Pupils are equal, round, and reactive to light.  Neck:     Thyroid : No thyromegaly.     Vascular: No carotid bruit or JVD.  Cardiovascular:     Rate and Rhythm: Normal rate and regular rhythm.     Heart sounds: Normal heart sounds.  Pulmonary:     Effort: Pulmonary effort is normal. No respiratory distress.     Breath sounds: Normal breath sounds.  Chest:  Breasts:    Breasts are symmetrical.     Right: Normal. No swelling, inverted nipple, mass, nipple discharge, skin change or tenderness.     Left: Normal. No swelling, inverted nipple, mass, nipple discharge, skin change or tenderness.  Abdominal:     General: Bowel sounds are normal.     Palpations: Abdomen is soft. There is no mass.     Tenderness: There is no abdominal tenderness. There is no guarding or rebound.  Musculoskeletal:        General: Normal range of motion.     Cervical back: Normal range of motion and neck supple.  Lymphadenopathy:     Cervical: No cervical adenopathy.     Upper Body:     Right upper body: No supraclavicular, axillary or pectoral adenopathy.     Left upper body: No supraclavicular, axillary or pectoral adenopathy.  Skin:    General: Skin is warm and dry.  Neurological:     General: No focal deficit present.     Mental Status: She is alert and oriented  to person, place, and time. Mental status is at baseline.  Psychiatric:        Mood and Affect: Mood normal.        Behavior: Behavior normal.        Thought Content: Thought content normal.        Judgment: Judgment normal.     Assessment and Plan: Positive colorectal cancer screening using Cologuard test Assessment & Plan: Colonoscopy has been cancelled twice due to patient illness and she has been lost to follow up for over one year.   Repeat referral made to Dr Unk at [facility]   Orders: -     Ambulatory referral to  Gastroenterology  Precancerous lesion -     Ambulatory referral to Dermatology  Teeth clenching Assessment & Plan: Occurring during sleep only;  Trial of tizanidine  to be repeated    Overweight (BMI 25.0-29.9) Assessment & Plan: I have  encouraged  Continued weight loss with goal of 10% of body weight over the next 6 months using a low glycemic index diet and regular exercise a minimum of 5 days per week.     Tobacco abuse counseling Assessment & Plan: She has been tobacco free since February 2018 with the help of chantix  , but resumed smoking since her last visit.  She is well aware of the risks of continued tobacco use bt has deferred quitting until the Fall.    Encounter for preventive health examination Assessment & Plan: age appropriate education and counseling updated, referrals for preventative services and immunizations addressed, dietary and smoking counseling addressed, most recent labs reviewed.  I have personally reviewed and have noted:   1) the patient's medical and social history 2) The pt's use of alcohol, tobacco, and illicit drugs 3) The patient's current medications and supplements 4) Functional ability including ADL's, fall risk, home safety risk, hearing and visual impairment 5) Diet and physical activities 6) Evidence for depression or mood disorder 7) The patient's height, weight, and BMI have been recorded in the chart    I have made referrals, and provided counseling and education based on review of the above    Stage 3a chronic kidney disease (HCC) Assessment & Plan: Secondary to NSAID use.  She has  suspended ibuprofen use   Use tylenol only.   Urinary protein and anemia screen are overdue Lab Results  Component Value Date   CREATININE 0.97 01/09/2023   Lab Results  Component Value Date   NA 137 01/09/2023   K 4.3 01/09/2023   CL 103 01/09/2023   CO2 29 01/09/2023   Lab Results  Component Value Date   MICROALBUR <0.2 01/27/2021     Lab Results  Component Value Date   WBC 6.5 01/09/2023   HGB 13.5 01/09/2023   HCT 40.1 01/09/2023   MCV 95.5 01/09/2023   PLT 235.0 01/09/2023  '   Other orders -     Escitalopram  Oxalate; Take 1 tablet (10 mg total) by mouth daily.  Dispense: 90 tablet; Refill: 1 -     Fluticasone  Propionate; Place 1 spray into both nostrils daily.  Dispense: 15.8 mL; Refill: 12 -     Acyclovir ; Take 1 tablet (800 mg total) by mouth daily.  Dispense: 90 tablet; Refill: 1 -     tiZANidine  HCl; TAKE ONE TABLET BY MOUTH EVERY 6 HOURS AS NEEDED FOR MUSCLE SPASMS  Dispense: 30 tablet; Refill: 5    Return in about 6 months (around 12/23/2024).  Verneita LITTIE Kettering, MD

## 2024-06-22 NOTE — Assessment & Plan Note (Signed)
 Trial of tizanidine  to be repeated

## 2024-06-22 NOTE — Patient Instructions (Addendum)
 YOUR MAMMOGRAM  is overdue and has been ordered , PLEASE CALL AND GET THIS SCHEDULED! [facility] - call [phone]  Veronda does  the scheduling for [facility] as well      I have made a referral to [facility] for the colonoscopy  Try using tizanidine   for the jaw clenching  at night and skip the buspirone   Lexapro : restart at 1/2 tablet daily with food for the first few days to avoid nausea

## 2024-06-23 ENCOUNTER — Ambulatory Visit: Payer: Self-pay | Admitting: Internal Medicine

## 2024-06-23 LAB — TSH: TSH: 1.99 u[IU]/mL (ref 0.35–5.50)

## 2024-06-23 NOTE — Assessment & Plan Note (Signed)
 She has been tobacco free since February 2018 with the help of chantix  , but resumed smoking since her last visit.  She is well aware of the risks of continued tobacco use bt has deferred quitting until the Fall.

## 2024-06-23 NOTE — Assessment & Plan Note (Signed)
 Secondary to NSAID use.  She has  suspended ibuprofen use   Use tylenol only.   Urinary protein and anemia screen are overdue Lab Results  Component Value Date   CREATININE 0.97 01/09/2023   Lab Results  Component Value Date   NA 137 01/09/2023   K 4.3 01/09/2023   CL 103 01/09/2023   CO2 29 01/09/2023   Lab Results  Component Value Date   MICROALBUR <0.2 01/27/2021     Lab Results  Component Value Date   WBC 6.5 01/09/2023   HGB 13.5 01/09/2023   HCT 40.1 01/09/2023   MCV 95.5 01/09/2023   PLT 235.0 01/09/2023  '

## 2024-06-23 NOTE — Assessment & Plan Note (Signed)
 Colonoscopy has been cancelled twice due to patient illness and she has been lost to follow up for over one year.   Repeat referral made to Dr Unk at [facility]

## 2024-06-23 NOTE — Assessment & Plan Note (Signed)
I have  encouraged  Continued weight loss with goal of 10% of body weight over the next 6 months using a low glycemic index diet and regular exercise a minimum of 5 days per week.   

## 2024-06-23 NOTE — Assessment & Plan Note (Signed)

## 2024-07-06 ENCOUNTER — Other Ambulatory Visit: Payer: Self-pay | Admitting: Internal Medicine

## 2024-07-06 DIAGNOSIS — Z1231 Encounter for screening mammogram for malignant neoplasm of breast: Secondary | ICD-10-CM

## 2024-07-10 ENCOUNTER — Ambulatory Visit (INDEPENDENT_AMBULATORY_CARE_PROVIDER_SITE_OTHER): Admitting: Internal Medicine

## 2024-07-10 ENCOUNTER — Encounter: Payer: Self-pay | Admitting: Internal Medicine

## 2024-07-10 VITALS — BP 108/60 | HR 76 | Ht 67.0 in | Wt 162.2 lb

## 2024-07-10 DIAGNOSIS — Z122 Encounter for screening for malignant neoplasm of respiratory organs: Secondary | ICD-10-CM

## 2024-07-10 DIAGNOSIS — G9332 Myalgic encephalomyelitis/chronic fatigue syndrome: Secondary | ICD-10-CM | POA: Diagnosis not present

## 2024-07-10 DIAGNOSIS — N1831 Chronic kidney disease, stage 3a: Secondary | ICD-10-CM | POA: Diagnosis not present

## 2024-07-10 DIAGNOSIS — R7303 Prediabetes: Secondary | ICD-10-CM

## 2024-07-10 DIAGNOSIS — U099 Post covid-19 condition, unspecified: Secondary | ICD-10-CM

## 2024-07-10 DIAGNOSIS — Z716 Tobacco abuse counseling: Secondary | ICD-10-CM | POA: Diagnosis not present

## 2024-07-10 NOTE — Patient Instructions (Addendum)
  Healthy Choice low carb power bowl entrees, , STEAMER ENTREES  and FITKITCHEN cauliflower pizza bowls are great low carb entrees that microwave in 5 minutes    I will do the work up for the kidney issues (labs,  ultrasound)  I am referring you to the lung cancer screening group headed by Lauraine Lites (annual CT Scan )  Reduce the starches in your diet to address the A1c of 6.0 (prediabetes)  Veggies Made Great ! Makes a low carb spinach & egg white frittata    TRY THE HEALTHY CHOICE AND WEIGHT WATCHERS D=FUDGE BARS  Recommended Low Carb high Protein premixed Shakes (or in powder form) rather than carnation instand breakfast drink:  Premier Protein  Atkins Advantage Muscle Milk EAS AdvantEdge  Fairlife   All of these are available at [employer], [employer],  [employer], and [employer]  And taste good

## 2024-07-10 NOTE — Progress Notes (Unsigned)
 Subjective:  Patient ID: Ashley [name], female    DOB: 1961/04/06  Age: 63 y.o. MRN: [id number]  CC: There were no encounter diagnoses.   HPI Ashley [name] presents for  Chief Complaint  Patient presents with   follow up on lab results      Outpatient Medications Prior to Visit  Medication Sig Dispense Refill   acyclovir  (ZOVIRAX ) 800 MG tablet Take 1 tablet (800 mg total) by mouth daily. 90 tablet 1   escitalopram  (LEXAPRO ) 10 MG tablet Take 1 tablet (10 mg total) by mouth daily. 90 tablet 1   fexofenadine  (ALLEGRA ) 180 MG tablet Take 1 tablet (180 mg total) by mouth daily. 90 tablet 1   fluticasone  (FLONASE ) 50 MCG/ACT nasal spray Place 1 spray into both nostrils daily. 15.8 mL 12   tiZANidine  (ZANAFLEX ) 4 MG tablet TAKE ONE TABLET BY MOUTH EVERY 6 HOURS AS NEEDED FOR MUSCLE SPASMS 30 tablet 5   busPIRone  (BUSPAR ) 10 MG tablet TAKE 1 TABLET BY MOUTH THREE TIMES DAILY (Patient not taking: Reported on 07/10/2024) 270 tablet 1   No facility-administered medications prior to visit.    Review of Systems;  Patient denies headache, fevers, malaise, unintentional weight loss, skin rash, eye pain, sinus congestion and sinus pain, sore throat, dysphagia,  hemoptysis , cough, dyspnea, wheezing, chest pain, palpitations, orthopnea, edema, abdominal pain, nausea, melena, diarrhea, constipation, flank pain, dysuria, hematuria, urinary  Frequency, nocturia, numbness, tingling, seizures,  Focal weakness, Loss of consciousness,  Tremor, insomnia, depression, anxiety, and suicidal ideation.      Objective:  BP 108/60   Pulse 76   Ht 5' 7 (1.702 m)   Wt 162 lb 3.2 oz (73.6 kg)   SpO2 98%   BMI 25.40 kg/m   BP Readings from Last 3 Encounters:  07/10/24 108/60  06/22/24 96/64  01/09/23 (!) 142/84    Wt Readings from Last 3 Encounters:  07/10/24 162 lb 3.2 oz (73.6 kg)  06/22/24 161 lb 9.6 oz (73.3 kg)  11/11/23 163 lb (73.9 kg)    Physical Exam  Lab Results  Component Value Date    HGBA1C 6.0 06/22/2024   HGBA1C 5.9 01/09/2023   HGBA1C 5.9 11/06/2021    Lab Results  Component Value Date   CREATININE 1.07 06/22/2024   CREATININE 0.97 01/09/2023   CREATININE 1.11 11/06/2021    Lab Results  Component Value Date   WBC 5.2 06/22/2024   HGB 13.9 06/22/2024   HCT 42.1 06/22/2024   PLT 231.0 06/22/2024   GLUCOSE 94 06/22/2024   CHOL 265 (H) 06/22/2024   TRIG 83.0 06/22/2024   HDL 60.00 06/22/2024   LDLDIRECT 176.0 06/22/2024   LDLCALC 188 (H) 06/22/2024   ALT 14 06/22/2024   AST 19 06/22/2024   NA 139 06/22/2024   K 4.7 06/22/2024   CL 102 06/22/2024   CREATININE 1.07 06/22/2024   BUN 21 06/22/2024   CO2 29 06/22/2024   TSH 1.99 06/22/2024   HGBA1C 6.0 06/22/2024   MICROALBUR <0.2 01/27/2021    MM 3D SCREEN BREAST BILATERAL Result Date: 05/13/2023 CLINICAL DATA:  Screening. EXAM: DIGITAL SCREENING BILATERAL MAMMOGRAM WITH TOMOSYNTHESIS AND CAD TECHNIQUE: Bilateral screening digital craniocaudal and mediolateral oblique mammograms were obtained. Bilateral screening digital breast tomosynthesis was performed. The images were evaluated with computer-aided detection. COMPARISON:  Previous exam(s). ACR Breast Density Category b: There are scattered areas of fibroglandular density. FINDINGS: There are no findings suspicious for malignancy. IMPRESSION: No mammographic evidence of malignancy. A result letter  of this screening mammogram will be mailed directly to the patient. RECOMMENDATION: Screening mammogram in one year. (Code:[id number]) BI-RADS CATEGORY  1: Negative. Electronically Signed   By: Dobrinka  Dimitrova M.D.   On: 05/13/2023 16:01    Assessment & Plan:  .There are no diagnoses linked to this encounter.   I spent 34 minutes on the day of this face to face encounter reviewing patient's  most recent visit with cardiology,  nephrology,  and neurology,  prior relevant surgical and non surgical procedures, recent  labs and imaging studies, counseling on  weight management,  reviewing the assessment and plan with patient, and post visit ordering and reviewing of  diagnostics and therapeutics with patient  .   Follow-up: No follow-ups on file.   Verneita LITTIE Kettering, MD

## 2024-07-13 DIAGNOSIS — Z122 Encounter for screening for malignant neoplasm of respiratory organs: Secondary | ICD-10-CM | POA: Insufficient documentation

## 2024-07-13 DIAGNOSIS — R7303 Prediabetes: Secondary | ICD-10-CM | POA: Insufficient documentation

## 2024-07-13 NOTE — Assessment & Plan Note (Signed)
 She had been tobacco free since February 2018 with the help of chantix  , but resumed smoking last year.   She is well aware of the risks of continued tobacco and is interested in lung ca screening to help motivate her to quit

## 2024-07-13 NOTE — Assessment & Plan Note (Signed)
 Her  random glucose is not  elevated but her A1c suggests she is at risk for developing diabetes.  I recommend he follow a low glycemic index diet and particpate regularly in an aerobic  exercise activity.  We should check an A1c in 6 months.    Lab Results  Component Value Date   HGBA1C 6.0 06/22/2024

## 2024-07-13 NOTE — Assessment & Plan Note (Addendum)
 Etiology unclear.. screening for PCKD, hydronephrosis  atrophic kidney, nephrotic syndrome, MM  in process  renal ultrasound, SPEP/urine IFE  PTH ordered

## 2024-07-20 ENCOUNTER — Telehealth: Payer: Self-pay

## 2024-07-20 ENCOUNTER — Ambulatory Visit
Admission: RE | Admit: 2024-07-20 | Discharge: 2024-07-20 | Disposition: A | Discharge status: Home / Self Care | Source: Ambulatory Visit | Attending: Internal Medicine | Admitting: Internal Medicine

## 2024-07-20 DIAGNOSIS — F17210 Nicotine dependence, cigarettes, uncomplicated: Secondary | ICD-10-CM

## 2024-07-20 DIAGNOSIS — Z1231 Encounter for screening mammogram for malignant neoplasm of breast: Secondary | ICD-10-CM | POA: Insufficient documentation

## 2024-07-20 DIAGNOSIS — Z87891 Personal history of nicotine dependence: Secondary | ICD-10-CM

## 2024-07-20 DIAGNOSIS — Z122 Encounter for screening for malignant neoplasm of respiratory organs: Secondary | ICD-10-CM

## 2024-07-20 NOTE — Telephone Encounter (Signed)
 Lung Cancer Screening Narrative/Criteria Questionnaire (Cigarette Smokers Only- No Cigars/Pipes/vapes)   Ashley [name]   SDMV:07/29/2024 at 10:15 am with [facility]     63 y.o.                         LDCT: 07/31/2024 at 8:00 am at [facility]   Phone: [phone]  Lung Screening Narrative (confirm age 35-77 yrs Medicare / 50-80 yrs Private pay insurance)   Insurance information: BCBS   Referring Provider: Tullo,MD   This screening involves an initial phone call with a team member from our program. It is called a shared decision making visit. The initial meeting is required by  insurance and Medicare to make sure you understand the program. This appointment takes about 15-20 minutes to complete. You will complete the screening scan at your scheduled date/time.  This scan takes about 5-10 minutes to complete. You can eat and drink normally before and after the scan.  Criteria questions for Lung Cancer Screening:   Are you a current or former smoker? Current Age began smoking: 74   If you are a former smoker, what year did you quit smoking? Quit for 4 years and started back (within 15 yrs)   To calculate your smoking history, I need an accurate estimate of how many packs of cigarettes you smoked per day and for how many years. (Not just the number of PPD you are now smoking)   Years smoking 40 x Packs per day 1 = Pack years 40   (at least 20 pack yrs)   (Make sure they understand that we need to know how much they have smoked in the past, not just the number of PPD they are smoking now)  Do you have a personal history of cancer?  No    Do you have a family history of cancer? Yes  (cancer type and and relative) Mother had lung cancer.   Are you coughing up blood?  No  Have you had unexplained weight loss of 15 lbs or more in the last 6 months? No  It looks like you meet all criteria.  When would be a good time for us  to schedule you for this screening?   Additional information: N/A

## 2024-07-23 ENCOUNTER — Ambulatory Visit
Admission: RE | Admit: 2024-07-23 | Discharge: 2024-07-23 | Disposition: A | Discharge status: Home / Self Care | Source: Ambulatory Visit | Attending: Internal Medicine | Admitting: Internal Medicine

## 2024-07-23 DIAGNOSIS — N1831 Chronic kidney disease, stage 3a: Secondary | ICD-10-CM | POA: Insufficient documentation

## 2024-07-23 DIAGNOSIS — N189 Chronic kidney disease, unspecified: Secondary | ICD-10-CM | POA: Diagnosis not present

## 2024-07-29 ENCOUNTER — Encounter: Payer: Self-pay | Admitting: Adult Health

## 2024-07-29 ENCOUNTER — Ambulatory Visit: Admitting: Adult Health

## 2024-07-29 DIAGNOSIS — F17210 Nicotine dependence, cigarettes, uncomplicated: Secondary | ICD-10-CM

## 2024-07-29 NOTE — Progress Notes (Signed)
  Virtual Visit via Telephone Note  I connected with Ashley [name] , 07/29/24 10:23 AM by a telemedicine application and verified that I am speaking with the correct person using two identifiers.  Location: Patient: home Provider: home   I discussed the limitations of evaluation and management by telemedicine and the availability of in person appointments. The patient expressed understanding and agreed to proceed.   Shared Decision Making Visit Lung Cancer Screening Program [phone])   Eligibility: 63 y.o. Pack Years Smoking History Calculation = 40 pack years  (# packs/per year x # years smoked) Recent History of coughing up blood  no Unexplained weight loss? no ( >Than 15 pounds within the last 6 months ) Prior History Lung / other cancer no (Diagnosis within the last 5 years already requiring surveillance chest CT Scans). Smoking Status Current Smoker   Visit Components: Discussion included one or more decision making aids. YES Discussion included risk/benefits of screening. YES Discussion included potential follow up diagnostic testing for abnormal scans. YES Discussion included meaning and risk of over diagnosis. YES Discussion included meaning and risk of False Positives. YES Discussion included meaning of total radiation exposure. YES  Counseling Included: Importance of adherence to annual lung cancer LDCT screening. YES Impact of comorbidities on ability to participate in the program. YES Ability and willingness to under diagnostic treatment. YES  Smoking Cessation Counseling: Current Smokers:  Discussed importance of smoking cessation. yes Information about tobacco cessation classes and interventions provided to patient. yes Patient provided with ticket for LDCT Scan. yes Symptomatic Patient. NO Diagnosis Code: Tobacco Use Z72.0 Asymptomatic Patient yes  Counseling - 4 minutes of smoking cessation counseling (CT Chest Lung Cancer Screening Low Dose W/O CM)  [id number]  Z12.2-Screening of respiratory organs Z87.891-Personal history of nicotine dependence   [name] 07/29/24

## 2024-07-29 NOTE — Patient Instructions (Signed)

## 2024-07-31 ENCOUNTER — Ambulatory Visit
Admission: RE | Admit: 2024-07-31 | Discharge: 2024-07-31 | Disposition: A | Discharge status: Home / Self Care | Source: Ambulatory Visit | Attending: Acute Care | Admitting: Acute Care

## 2024-07-31 DIAGNOSIS — Z122 Encounter for screening for malignant neoplasm of respiratory organs: Secondary | ICD-10-CM | POA: Diagnosis not present

## 2024-07-31 DIAGNOSIS — F17210 Nicotine dependence, cigarettes, uncomplicated: Secondary | ICD-10-CM | POA: Insufficient documentation

## 2024-07-31 DIAGNOSIS — Z87891 Personal history of nicotine dependence: Secondary | ICD-10-CM | POA: Insufficient documentation

## 2024-08-05 ENCOUNTER — Ambulatory Visit: Payer: Self-pay | Admitting: Internal Medicine

## 2024-08-14 ENCOUNTER — Telehealth: Payer: Self-pay | Admitting: Acute Care

## 2024-08-14 DIAGNOSIS — R911 Solitary pulmonary nodule: Secondary | ICD-10-CM

## 2024-08-14 NOTE — Telephone Encounter (Signed)
 6 month follow up since baseline scan had a 16 mm gg area. Due March 2026. Thanks

## 2024-08-17 DIAGNOSIS — D2261 Melanocytic nevi of right upper limb, including shoulder: Secondary | ICD-10-CM | POA: Diagnosis not present

## 2024-08-17 DIAGNOSIS — D2272 Melanocytic nevi of left lower limb, including hip: Secondary | ICD-10-CM | POA: Diagnosis not present

## 2024-08-17 DIAGNOSIS — D225 Melanocytic nevi of trunk: Secondary | ICD-10-CM | POA: Diagnosis not present

## 2024-08-17 DIAGNOSIS — D2262 Melanocytic nevi of left upper limb, including shoulder: Secondary | ICD-10-CM | POA: Diagnosis not present

## 2024-08-17 NOTE — Telephone Encounter (Signed)
 Spoke with pt and reviewed CT results per Lauraine Lites, NP. She recommends to repeat CT in 6 months to relook at ground glass area. Pt verbalized understanding. Results / plans sent to PCP. Order placed for 6 month nodule follow up CT.

## 2024-12-25 ENCOUNTER — Ambulatory Visit: Admitting: Internal Medicine

## 2024-12-25 ENCOUNTER — Encounter: Payer: Self-pay | Admitting: Internal Medicine

## 2024-12-25 VITALS — BP 110/62 | HR 73 | Temp 97.9°F | Ht 67.0 in | Wt 160.2 lb

## 2024-12-25 DIAGNOSIS — N1831 Chronic kidney disease, stage 3a: Secondary | ICD-10-CM | POA: Diagnosis not present

## 2024-12-25 DIAGNOSIS — F17200 Nicotine dependence, unspecified, uncomplicated: Secondary | ICD-10-CM

## 2024-12-25 DIAGNOSIS — Z716 Tobacco abuse counseling: Secondary | ICD-10-CM

## 2024-12-25 DIAGNOSIS — E785 Hyperlipidemia, unspecified: Secondary | ICD-10-CM | POA: Diagnosis not present

## 2024-12-25 DIAGNOSIS — Z205 Contact with and (suspected) exposure to viral hepatitis: Secondary | ICD-10-CM

## 2024-12-25 DIAGNOSIS — Z1211 Encounter for screening for malignant neoplasm of colon: Secondary | ICD-10-CM

## 2024-12-25 DIAGNOSIS — D225 Melanocytic nevi of trunk: Secondary | ICD-10-CM

## 2024-12-25 DIAGNOSIS — R5383 Other fatigue: Secondary | ICD-10-CM

## 2024-12-25 DIAGNOSIS — R195 Other fecal abnormalities: Secondary | ICD-10-CM | POA: Diagnosis not present

## 2024-12-25 DIAGNOSIS — N951 Menopausal and female climacteric states: Secondary | ICD-10-CM

## 2024-12-25 DIAGNOSIS — Z780 Asymptomatic menopausal state: Secondary | ICD-10-CM | POA: Insufficient documentation

## 2024-12-25 DIAGNOSIS — R7301 Impaired fasting glucose: Secondary | ICD-10-CM | POA: Diagnosis not present

## 2024-12-25 LAB — RENAL FUNCTION PANEL
Albumin: 4.6 g/dL (ref 3.5–5.2)
BUN: 15 mg/dL (ref 6–23)
CO2: 30 meq/L (ref 19–32)
Calcium: 9.8 mg/dL (ref 8.4–10.5)
Chloride: 103 meq/L (ref 96–112)
Creatinine, Ser: 0.92 mg/dL (ref 0.40–1.20)
GFR: 66.59 mL/min
Glucose, Bld: 85 mg/dL (ref 70–99)
Phosphorus: 3.4 mg/dL (ref 2.3–4.6)
Potassium: 4.3 meq/L (ref 3.5–5.1)
Sodium: 141 meq/L (ref 135–145)

## 2024-12-25 LAB — MICROALBUMIN / CREATININE URINE RATIO
Creatinine,U: 147.8 mg/dL
Microalb Creat Ratio: 9.4 mg/g (ref 0.0–30.0)
Microalb, Ur: 1.4 mg/dL (ref 0.7–1.9)

## 2024-12-25 MED ORDER — ESCITALOPRAM OXALATE 10 MG PO TABS: 10.0000 mg | ORAL_TABLET | Freq: Every day | ORAL | 1 refills | Status: AC

## 2024-12-25 NOTE — Assessment & Plan Note (Signed)
 Recommended retesting given prior exposure to now deceased husband sherrill , [facility] IS NEGATIVE   Lab Results  Component Value Date   HEPCAB NON-REACTIVE 12/25/2024

## 2024-12-25 NOTE — Assessment & Plan Note (Signed)
 She was referred to [facility] in July 2025 but never contacted.  Repeat referral made

## 2024-12-25 NOTE — Assessment & Plan Note (Signed)
 GFR has fluctuated . . screening for PCKD, hydronephrosis  atrophic kidney, nephrotic syndrome, MM   in process.   renal ultrasound  was normal

## 2024-12-25 NOTE — Patient Instructions (Signed)
" °  Your referral   to [facility] has been repeated . If you do not hear from our office or  thr [facility] office  in 14 days   Please let me know.  "

## 2024-12-25 NOTE — Assessment & Plan Note (Signed)
 Referred to [facility],  biopsies were done in Sept and neg for melanoma

## 2024-12-25 NOTE — Assessment & Plan Note (Signed)
Managed with lexapro 

## 2024-12-25 NOTE — Assessment & Plan Note (Signed)
 She has reduced use to < 1 pack weekly .  Smokes to relieve stress and because she enjoys it with her  glass of wine . She is aware of the risks inheret with tobacco abuse and defers pharmacotherapy

## 2024-12-27 DIAGNOSIS — F17200 Nicotine dependence, unspecified, uncomplicated: Secondary | ICD-10-CM | POA: Insufficient documentation

## 2024-12-27 NOTE — Assessment & Plan Note (Signed)
 She is s/p TAH remotely . Using lexapro  to manage hot flashes

## 2024-12-30 LAB — HEPATITIS C ANTIBODY: Hepatitis C Ab: NONREACTIVE

## 2024-12-30 LAB — PROTEIN ELECTROPHORESIS, SERUM
Albumin ELP: 4.6 g/dL (ref 3.8–4.8)
Alpha 1: 0.3 g/dL (ref 0.2–0.3)
Alpha 2: 0.7 g/dL (ref 0.5–0.9)
Beta 2: 0.3 g/dL (ref 0.2–0.5)
Beta Globulin: 0.5 g/dL (ref 0.4–0.6)
Gamma Globulin: 0.7 g/dL — ABNORMAL LOW (ref 0.8–1.7)
Total Protein: 7.1 g/dL (ref 6.1–8.1)

## 2025-06-29 ENCOUNTER — Encounter: Admitting: Internal Medicine
